# Patient Record
Sex: Female | Born: 1945 | Race: White | Hispanic: No | Smoking: Former smoker
Health system: Southern US, Community
[De-identification: ages and names within clinical notes are randomized; demographics above are authoritative.]

## PROBLEM LIST (undated history)

## (undated) DIAGNOSIS — M1990 Unspecified osteoarthritis, unspecified site: Secondary | ICD-10-CM

## (undated) DIAGNOSIS — H269 Unspecified cataract: Secondary | ICD-10-CM

## (undated) DIAGNOSIS — R011 Cardiac murmur, unspecified: Secondary | ICD-10-CM

## (undated) DIAGNOSIS — M810 Age-related osteoporosis without current pathological fracture: Secondary | ICD-10-CM

## (undated) DIAGNOSIS — N200 Calculus of kidney: Secondary | ICD-10-CM

## (undated) DIAGNOSIS — F419 Anxiety disorder, unspecified: Secondary | ICD-10-CM

## (undated) DIAGNOSIS — D334 Benign neoplasm of spinal cord: Secondary | ICD-10-CM

## (undated) DIAGNOSIS — E785 Hyperlipidemia, unspecified: Secondary | ICD-10-CM

## (undated) DIAGNOSIS — Z87442 Personal history of urinary calculi: Secondary | ICD-10-CM

## (undated) DIAGNOSIS — H3530 Unspecified macular degeneration: Secondary | ICD-10-CM

## (undated) DIAGNOSIS — C20 Malignant neoplasm of rectum: Secondary | ICD-10-CM

## (undated) DIAGNOSIS — C189 Malignant neoplasm of colon, unspecified: Secondary | ICD-10-CM

## (undated) DIAGNOSIS — G709 Myoneural disorder, unspecified: Secondary | ICD-10-CM

## (undated) DIAGNOSIS — D649 Anemia, unspecified: Secondary | ICD-10-CM

## (undated) HISTORY — DX: Myoneural disorder, unspecified: G70.9

## (undated) HISTORY — PX: COLONOSCOPY: SHX174

## (undated) HISTORY — PX: TONSILLECTOMY: SUR1361

## (undated) HISTORY — DX: Hyperlipidemia, unspecified: E78.5

## (undated) HISTORY — DX: Malignant neoplasm of colon, unspecified: C18.9

## (undated) HISTORY — DX: Cardiac murmur, unspecified: R01.1

## (undated) HISTORY — PX: EYE SURGERY: SHX253

## (undated) HISTORY — DX: Anemia, unspecified: D64.9

## (undated) HISTORY — DX: Unspecified osteoarthritis, unspecified site: M19.90

## (undated) HISTORY — DX: Unspecified macular degeneration: H35.30

## (undated) HISTORY — DX: Benign neoplasm of spinal cord: D33.4

## (undated) HISTORY — DX: Calculus of kidney: N20.0

## (undated) HISTORY — DX: Malignant neoplasm of rectum: C20

## (undated) HISTORY — PX: ABDOMINAL HYSTERECTOMY: SHX81

## (undated) HISTORY — DX: Unspecified cataract: H26.9

## (undated) HISTORY — DX: Age-related osteoporosis without current pathological fracture: M81.0

---

## 2015-11-21 HISTORY — PX: SPINE SURGERY: SHX786

## 2019-08-21 NOTE — Progress Notes (Signed)
[place]   [phone] Fax:([phone]) [facility] Note   Patient Care Team: System, Pcp Not In as PCP - General  Date of Service:  08/25/2019   CHIEF COMPLAINTS/PURPOSE OF CONSULTATION:  Rectal Cancer oncology   REFERRING PHYSICIAN:  Dr. Morton Stall  Oncology History Overview Note  Cancer Staging Rectal cancer [facility]) Staging form: Colon and Rectum, AJCC 8th Edition - Clinical stage from 05/11/2017: cT3, cN2, cM0 - Signed by Truitt Merle, MD on 08/25/2019    Rectal cancer ([place])  05/11/2017 Cancer Staging   Staging form: Colon and Rectum, AJCC 8th Edition - Clinical stage from 05/11/2017: cT3, cN2, cM0 - Signed by Truitt Merle, MD on 08/25/2019   05/11/2017 Procedure   Colonoscopy by Dr Crissie Reese at [facility] -Palpable rectal mass found on digital rectal exam  -Likely malignant tumor in the rectum. Biopsied.  -One 6 mm polyp in the sigmoid colon, removed with a cold snare, resected and retrieved.  -Diverticulosis at the hepatic flexure and in the ascending colon -the examined portion of the ileum was normal.    09/10/2017 Initial Biopsy   Final Diagnosis  1. Sigmoid Polyp biopsies:  Tubular Adenoma  2. Rectal Mass biopsies:  Moderately differentiated adenocarcinoma, invasive.  The depth of the invasion cannot be assessed in this biopsy specimen.     - 06/29/2017 Chemotherapy   She received neoadjuvant infusional 5FU with concurrent radiation.     09/17/2017 Procedure   Sigmoidoscopy by Dr. Tami Ribas at [facility] on 09/17/17  was noted to have complete response from chemoRT   10/31/2017 - 01/2018 Chemotherapy   Consolidation Chemo 5FU and Leucovorin. Oxaliplatin was deferred due to pre-existing neuropathy. Pt declined rectal surgery.    01/13/2019 Imaging   CT CAP W Contrast at [facility] show persistent and stable 64mm nodule in the right middle lobe and stable 61mm nodule in the superior segment of  the left lower lobe. No new or suspect nodules   Cholelithiasis without evidence of cholecystitis.   Mild compression of the superior plate of L5, Stable   Overall no evidence of disease recurrence in the chest, abdomen or pelvis.    08/25/2019 Initial Diagnosis   Rectal cancer (HCC)      HISTORY OF PRESENTING ILLNESS:  [name] 73 y.o. female is a here because of rectal cancer surveillance. The patient was referred by Dr. Morton Stall. The patient presents to the clinic today alone.   She notes GI bleeding for months before going to be seen because she had hemorrhoids. She was found to be stage IIIB rectal cancer. She did Concurrent chemoRT for 3-4 months. She also had adjuvant chemo. She notes she declined surgery for colostomy bag as she is does would not be able to tolerate bag. She notes she still has port which needs to be flushed.   Today she notes occasional pain in her rectum. In [place] 2 months ago she had a flare of severe rectal pain. She would only take medication for this for extreme pain. She notes 1-1.5 weeks ago her pain flared again (over 10/10) so much she felt she could go to ED. She took tramadol for this and has not again since. After that pain flare the pain lasted for 1-2 at a dull constant pain.  Around these times of pain flares she denied constipation. She notes her BM are regular. Her last colonoscopy was 05/2018.  She also notes chronic back pain  and has osteoporosis. She notes last week she fell at new home. She feels she fractured her right rib as she has done this in the past. She notes 05/2019 DEXA which showed osteoporosis -3.5. She is notes she was doing yearly Reclast injection, last in 05/2019.  She notes she was taking Lidocaine patches but would like a refill as she is almost out of it. She also takes diclofenac 1-2 tablets a day about 1-2 times a month and would like a refill of that as well.  She was recommended to see [facility] for PCP.  She has reached out to them but has not been called back yet.   Socially she is divorced. She moved to [place] 2 months ago from [place] to live closer to move in with her daughter and son-in-law. So she wanted to be seen at this clinic to follow her cancer closely. She stopped smoking 29 years ago at age 30 years old after smoking for 26 years 1-5ppd. She uses CBD as needed.  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 She is looking for ophthalmologist. 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 She had hysterectomy and BSO due to heavy bleeding. She also had C-section. She notes her mother died from 109 cancer. Her PGM had breast cancer. Her paternal half sister had lung cancer. Her maternal half brother had pancreatic cancer.    REVIEW OF SYSTEMS:    Constitutional: Denies fevers, chills or abnormal night sweats Eyes: Denies blurriness of vision, double vision or watery eyes Ears, nose, mouth, throat, and face: Denies mucositis or sore throat Respiratory: Denies cough, dyspnea or wheezes Cardiovascular: Denies palpitation, chest discomfort or lower extremity swelling Gastrointestinal:  Denies nausea, heartburn or change in bowel habits (+) Rectal pain  Skin: Denies abnormal skin rashes MSK: (+) Chronic back pain  Lymphatics: Denies new lymphadenopathy or easy bruising Neurological:Denies numbness, tingling or new weaknesses Behavioral/Psych: Mood is stable, no new changes  All other systems were reviewed with the patient and are negative.   MEDICAL HISTORY:  Past Medical History:  Diagnosis Date   Colon cancer ([place])    Neuromuscular disorder ([place])    Osteoporosis     SURGICAL HISTORY: Past Surgical History:  Procedure Laterality Date   ABDOMINAL HYSTERECTOMY      SOCIAL HISTORY: Social History   Socioeconomic History   Marital status: Divorced    Spouse name: Not on file   Number of  children: 1   Years of education: Not on file   Highest education level: Not on file  Occupational History   Not on file  Social Needs   Financial resource strain: Not on file   Food insecurity    Worry: Not on file    Inability: Not on file   Transportation needs    Medical: Not on file    Non-medical: Not on file  Tobacco Use   Smoking status: Former Smoker    Packs/day: 2.00    Years: 26.00    Pack years: 52.00    Quit date: 11/21/1987    Years since quitting: 31.7   Smokeless tobacco: Never Used  Substance and Sexual Activity   Alcohol use: Not on file   Drug use: Yes    Types: Marijuana   Sexual activity: Not on file  Lifestyle   Physical activity    Days per week: Not on file    Minutes per session: Not  on file   Stress: Not on file  Relationships   Social connections    Talks on phone: Not on file    Gets together: Not on file    Attends religious service: Not on file    Active member of club or organization: Not on file    Attends meetings of clubs or organizations: Not on file    Relationship status: Not on file   Intimate partner violence    Fear of current or ex partner: Not on file    Emotionally abused: Not on file    Physically abused: Not on file    Forced sexual activity: Not on file  Other Topics Concern   Not on file  Social History Narrative   Not on file    FAMILY HISTORY: Family History  Problem Relation Age of Onset   Cancer Mother        head and neck cancer    Cancer Sister        lung cancer    Cancer Brother        pancreatic cancer    Cancer Paternal Grandmother        breast cancer     ALLERGIES:  is allergic to morphine and related.  MEDICATIONS:  Current Outpatient Medications  Medication Sig Dispense Refill   diclofenac (CATAFLAM) 50 MG tablet Take 1 tablet (50 mg total) by mouth 3 (three) times daily. 20 tablet 1   lidocaine (LIDODERM) 5 % Place 1 patch onto the skin daily. Remove & Discard patch  within 12 hours or as directed by MD 30 patch 1   traMADol (ULTRAM) 50 MG tablet Take 50 mg by mouth every 6 (six) hours as needed (once a day).     valACYclovir (VALTREX) 1000 MG tablet Take 1,000 mg by mouth daily.     No current facility-administered medications for this visit.     PHYSICAL EXAMINATION: ECOG PERFORMANCE STATUS: 0 - Asymptomatic  Vitals:   08/25/19 1458  BP: (!) 143/73  Pulse: 87  Resp: 18  Temp: 98.5 F (36.9 C)  SpO2: 97%   Filed Weights   08/25/19 1458  Weight: 155 lb 11.2 oz (70.6 kg)    GENERAL:alert, no distress and comfortable SKIN: skin color, texture, turgor are normal, no rashes or significant lesions EYES: normal, Conjunctiva are pink and non-injected, sclera clear  NECK: supple, thyroid normal size, non-tender, without nodularity LYMPH:  no palpable lymphadenopathy in the cervical, axillary  LUNGS: clear to auscultation and percussion with normal breathing effort HEART: regular rate & rhythm and no murmurs and no lower extremity edema ABDOMEN:abdomen soft, non-tender and normal bowel sounds Musculoskeletal:no cyanosis of digits and no clubbing  NEURO: alert & oriented x 3 with fluent speech, no focal motor/sensory deficits RECTAL: (+) large externall hemorrhoids. No palpable mass. No blood on glove. Benign exam   LABORATORY DATA:  I have reviewed the data as listed No flowsheet data found.  No flowsheet data found.   RADIOGRAPHIC STUDIES: I have personally reviewed the radiological images as listed and agreed with the findings in the report. No results found.  ASSESSMENT & PLAN:  Geanie Glasby is a 73 y.o. [place] female with a history of Macular degeneration and cataract, Schwannoma of lumbar spine.   1. Rectal Cancer, Stage IIIB  -Diagnosed on 05/11/17. She was treated with concurrent ChemoRT and consolidation chemo 5-FU/LV for 3 months. She declined surgery as she did not want colostomy bag.  -Based on 09/17/17  Sigmoidoscopy  she had complete response from chemoRT.  Subsequent CT scan also showed no evidence of residual disease. -I discussed although standard care includes ChemoRT, surgery and adjuvant chemo, but some pts could be safely monitored   she has been doing well without surgery.  -I discussed shel has higher risk of cancer recurrence based on her clinical stage and no surgery. She has been followed with Surveillance since treatment. She has moved from [place] and has transferred her care to [place].  -I discussed continuing Surveillance, which is a physical exam and lab test (including CBC, CMP and CEA) every 3-4 months for the first 3 years, then every 6-12 months, colonoscopy every 6-12 months for up to 3-4 years, and surveillance CT or MRI scan every 6-12 month for up to 5 year.   -Her 06/03/18 sigmoidoscopy, 09/2018 MRI pelvis and 01/13/19 CT CAP were NED.  -In the past 2 months she has had flares of rectal pain. Rectal and physical exam today was unremarkable except hemorrhoids.  -She will proceed with colonoscopy in next few months, CT CAP this month and F/u in 4 months.  -She declined flu shot.    2. Rectal  pain  -2 months ago when she was in [place] she had significant flare of rectal pain. This occurred again and more severe 1-2 weeks ago.  -She only took Tramadol that night for it. Otherwise she takes Diclofenac or Tramadol as needed.  -She has regular BM and no GI bleeding.  -Her last colonoscopy was 05/2018. Will repeat this year when she finds new GI, I referred today  -Rectal exam benign today with hemorrhoids (08/25/19)   3. Chronic back pain, Osteoporosis  -04/2018 DEXA showed osteoporosis. Per patient she had DEXA in 05/2019 which showed Osteoporosis with T-score -3.5  -She did have recent fall and feels she has right rib fracture. She does not think she needs a scan to evaluate. -She has been on Reclast yearly injections since 05/22/18. Last injection in 05/2019.  -She uses  Lidocaine patches for pain, Diclofenac for significant pain 1-2 times a day 1-2 times a month. Only for severe pain she will take Tramadol.   4. Lung nodules  -Stable on 01/13/19 CT scan she has 57mm right lung and 78mm left lung nodules.  -She has 26 years of very heavy smoking (1-5ppd). She quit at age 71.  -Will monitor.    PLAN:  -I refilled Diclofenac and Lidocaine patches  -Send [facility] referral for colonoscopy in 1-2 months  -Lab, flush and CT CAP W Contrast in 2 weeks  -Port flush in 2 and 4 months  -Lab and f/u in 4 months    Orders Placed This Encounter  Procedures   CBC with Differential ([facility] Only)    Standing Status:   Standing    Number of Occurrences:   50    Standing Expiration Date:   08/24/2024   CMP ([facility] only)    Standing Status:   Standing    Number of Occurrences:   50    Standing Expiration Date:   08/24/2024   CEA (IN HOUSE-[facility])    Standing Status:   Standing    Number of Occurrences:   50    Standing Expiration Date:   08/24/2024   Ambulatory referral to Gastroenterology    Referral Priority:   Routine    Referral Type:   Consultation    Referral Reason:   Specialty Services Required    Number of Visits Requested:  1    All questions were answered. The patient knows to call the clinic with any problems, questions or concerns. I spent 40 minutes counseling the patient face to face. The total time spent in the appointment was 50 minutes and more than 50% was on counseling.     Truitt Merle, MD 08/25/2019   I, Joslyn Devon, am acting as scribe for Truitt Merle, MD.   I have reviewed the above documentation for accuracy and completeness, and I agree with the above.

## 2019-08-24 ENCOUNTER — Other Ambulatory Visit: Payer: Self-pay | Admitting: Hematology

## 2019-08-25 ENCOUNTER — Encounter: Payer: Self-pay | Admitting: Hematology

## 2019-08-25 ENCOUNTER — Inpatient Hospital Stay: Payer: Medicaid Other | Attending: Hematology | Admitting: Hematology

## 2019-08-25 ENCOUNTER — Telehealth: Payer: Self-pay | Admitting: Hematology

## 2019-08-25 ENCOUNTER — Other Ambulatory Visit: Payer: Self-pay

## 2019-08-25 DIAGNOSIS — Z87891 Personal history of nicotine dependence: Secondary | ICD-10-CM

## 2019-08-25 DIAGNOSIS — G8929 Other chronic pain: Secondary | ICD-10-CM | POA: Diagnosis not present

## 2019-08-25 DIAGNOSIS — H5462 Unqualified visual loss, left eye, normal vision right eye: Secondary | ICD-10-CM | POA: Diagnosis not present

## 2019-08-25 DIAGNOSIS — K6289 Other specified diseases of anus and rectum: Secondary | ICD-10-CM | POA: Diagnosis not present

## 2019-08-25 DIAGNOSIS — Z808 Family history of malignant neoplasm of other organs or systems: Secondary | ICD-10-CM | POA: Diagnosis not present

## 2019-08-25 DIAGNOSIS — Z800 Family history of malignant neoplasm of digestive organs: Secondary | ICD-10-CM | POA: Diagnosis not present

## 2019-08-25 DIAGNOSIS — M810 Age-related osteoporosis without current pathological fracture: Secondary | ICD-10-CM

## 2019-08-25 DIAGNOSIS — Z801 Family history of malignant neoplasm of trachea, bronchus and lung: Secondary | ICD-10-CM

## 2019-08-25 DIAGNOSIS — R911 Solitary pulmonary nodule: Secondary | ICD-10-CM

## 2019-08-25 DIAGNOSIS — C20 Malignant neoplasm of rectum: Secondary | ICD-10-CM

## 2019-08-25 DIAGNOSIS — Z803 Family history of malignant neoplasm of breast: Secondary | ICD-10-CM

## 2019-08-25 DIAGNOSIS — M545 Low back pain: Secondary | ICD-10-CM

## 2019-08-25 DIAGNOSIS — Z9181 History of falling: Secondary | ICD-10-CM | POA: Diagnosis not present

## 2019-08-25 MED ORDER — DICLOFENAC POTASSIUM 50 MG PO TABS: 50.0000 mg | ORAL_TABLET | Freq: Three times a day (TID) | ORAL | 1 refills | Status: DC

## 2019-08-25 MED ORDER — LIDOCAINE 5 % EX PTCH: 1.0000 | MEDICATED_PATCH | CUTANEOUS | 1 refills | Status: DC

## 2019-08-25 NOTE — Telephone Encounter (Signed)
Gave avs and calendar ° °

## 2019-08-26 ENCOUNTER — Encounter: Payer: Self-pay | Admitting: Hematology

## 2019-08-26 ENCOUNTER — Telehealth: Payer: Self-pay

## 2019-08-26 ENCOUNTER — Telehealth: Payer: Self-pay | Admitting: Gastroenterology

## 2019-08-26 ENCOUNTER — Other Ambulatory Visit: Payer: Self-pay

## 2019-08-26 DIAGNOSIS — C20 Malignant neoplasm of rectum: Secondary | ICD-10-CM

## 2019-08-26 NOTE — Telephone Encounter (Signed)
Hey Dr. Rush Landmark- we have received a referral for this patient to be seen at our office for a flexible sigmoidoscopy. I have received her records and you are the doc of the day. I am going to send these records to you for review.

## 2019-08-26 NOTE — Telephone Encounter (Signed)
Chart review.

## 2019-08-27 NOTE — Telephone Encounter (Signed)
Will review records when they are available. Thank you. GM

## 2019-08-31 NOTE — Telephone Encounter (Signed)
Review of outside records  July 2019 flexible sigmoidoscopy Perianal and digital exam were normal Evidence of previous rectal scar from CRT at first rectal valve.  Biopsied.  The exam was otherwise normal.  October 2018 flexible sigmoidoscopy Perianal exam included a palpable posterior rectal scar at first about presumably from neoadjuvant change. A 50 mm scar was found in the distal rectum.  There was residual polyp tissue.  This was biopsied with a cold jumbo forceps for histology.  The exam was otherwise without abnormality.  June 2018 colonoscopy for indication of hematochezia  The digital rectal exam included a palpable rectal mass. A fungating infiltrative and ulcerated nonobstructing large mass was found in the rectum.  The mass was partially circumferential involving one third of the lumen circumference.  The mass measured 7 cm in length.  No bleeding was present.  Biopsies were taken. A 6 mm polyp on the sigmoid colon.  This was semi-pedunculated.  This was removed with cold snare. A few medium mouth diverticula at the hepatic flexure and ascending colon. The exam is otherwise normal throughout the examined colon. The terminal ileum appeared normal.   The patient for some reason has not had a formal full colonoscopy post her chemoradiation.  As such, I think a formal full colonoscopy would be helpful rather than just a flexible sigmoidoscopy has a will also help [place] make sure there is nothing else in the colon as well as evaluate the rectum as is being asked by oncology.  Okay to proceed with scheduling full colonoscopy direct procedure and then we can discuss symptoms of a rectal pain thereafter. If hemorrhoids are found as the most recent physical exam by oncology suggested then will decide about the role of hemorrhoidal banding if they are internal versus colorectal surgery evaluation if they are external. None of her endoscopic evaluation over the course the last 2 years from [facility]  has shown any evidence of hemorrhoids. This is why I would like to also proceed with a full repeat colonoscopy for [place] to know what is going on here.    Justice Britain, MD [facility] # [id number]

## 2019-09-01 NOTE — Telephone Encounter (Signed)
Sounds good, Dr. Rush Landmark. Please proceed with colonoscopy soon. Thanks!  Truitt Merle MD

## 2019-09-01 NOTE — Telephone Encounter (Signed)
Please move forward with scheduling colonoscopy in [place]. Thanks. GM

## 2019-09-08 ENCOUNTER — Other Ambulatory Visit: Payer: Self-pay

## 2019-09-08 ENCOUNTER — Ambulatory Visit (HOSPITAL_COMMUNITY)
Admission: RE | Admit: 2019-09-08 | Discharge: 2019-09-08 | Disposition: A | Discharge status: Home / Self Care | Payer: Medicaid Other | Source: Ambulatory Visit | Attending: Hematology | Admitting: Hematology

## 2019-09-08 ENCOUNTER — Encounter (HOSPITAL_COMMUNITY): Payer: Self-pay | Admitting: Radiology

## 2019-09-08 ENCOUNTER — Inpatient Hospital Stay: Payer: Medicaid Other

## 2019-09-08 ENCOUNTER — Telehealth: Payer: Self-pay

## 2019-09-08 DIAGNOSIS — Z95828 Presence of other vascular implants and grafts: Secondary | ICD-10-CM | POA: Insufficient documentation

## 2019-09-08 DIAGNOSIS — Z5111 Encounter for antineoplastic chemotherapy: Secondary | ICD-10-CM | POA: Diagnosis not present

## 2019-09-08 DIAGNOSIS — C20 Malignant neoplasm of rectum: Secondary | ICD-10-CM | POA: Diagnosis not present

## 2019-09-08 DIAGNOSIS — R52 Pain, unspecified: Secondary | ICD-10-CM

## 2019-09-08 DIAGNOSIS — Z452 Encounter for adjustment and management of vascular access device: Secondary | ICD-10-CM | POA: Diagnosis not present

## 2019-09-08 LAB — CBC WITH DIFFERENTIAL (CANCER CENTER ONLY)
Abs Immature Granulocytes: 0.02 10*3/uL (ref 0.00–0.07)
Basophils Absolute: 0.1 10*3/uL (ref 0.0–0.1)
Basophils Relative: 1 %
Eosinophils Absolute: 0.1 10*3/uL (ref 0.0–0.5)
Eosinophils Relative: 2 %
HCT: 38 % (ref 36.0–46.0)
Hemoglobin: 12.8 g/dL (ref 12.0–15.0)
Immature Granulocytes: 0 %
Lymphocytes Relative: 19 %
Lymphs Abs: 1 10*3/uL (ref 0.7–4.0)
MCH: 29.2 pg (ref 26.0–34.0)
MCHC: 33.7 g/dL (ref 30.0–36.0)
MCV: 86.6 fL (ref 80.0–100.0)
Monocytes Absolute: 0.5 10*3/uL (ref 0.1–1.0)
Monocytes Relative: 8 %
Neutro Abs: 3.8 10*3/uL (ref 1.7–7.7)
Neutrophils Relative %: 70 %
Platelet Count: 257 10*3/uL (ref 150–400)
RBC: 4.39 MIL/uL (ref 3.87–5.11)
RDW: 13.5 % (ref 11.5–15.5)
WBC Count: 5.5 10*3/uL (ref 4.0–10.5)
nRBC: 0 % (ref 0.0–0.2)

## 2019-09-08 LAB — CMP (CANCER CENTER ONLY)
ALT: 11 U/L (ref 0–44)
AST: 14 U/L — ABNORMAL LOW (ref 15–41)
Albumin: 4.1 g/dL (ref 3.5–5.0)
Alkaline Phosphatase: 38 U/L (ref 38–126)
Anion gap: 10 (ref 5–15)
BUN: 10 mg/dL (ref 8–23)
CO2: 22 mmol/L (ref 22–32)
Calcium: 10.1 mg/dL (ref 8.9–10.3)
Chloride: 107 mmol/L (ref 98–111)
Creatinine: 0.74 mg/dL (ref 0.44–1.00)
GFR, Est AFR Am: >60 mL/min (ref >60)
GFR, Estimated: >60 mL/min (ref >60)
Glucose, Bld: 104 mg/dL — ABNORMAL HIGH (ref 70–99)
Potassium: 4.1 mmol/L (ref 3.5–5.1)
Sodium: 139 mmol/L (ref 135–145)
Total Bilirubin: 0.3 mg/dL (ref 0.3–1.2)
Total Protein: 7 g/dL (ref 6.5–8.1)

## 2019-09-08 LAB — CEA (IN HOUSE-CHCC): CEA (CHCC-In House): <1 ng/mL (ref 0.00–5.00)

## 2019-09-08 MED ORDER — DICLOFENAC POTASSIUM(MIGRAINE) 50 MG PO PACK: 50.0000 mg | PACK | Freq: Three times a day (TID) | ORAL | 0 refills | Status: DC

## 2019-09-08 MED ORDER — SODIUM CHLORIDE 0.9% FLUSH
10.0000 mL | INTRAVENOUS | Status: DC | PRN
  Administered 2019-09-08 (×2): 10 mL via INTRAVENOUS
  Filled 2019-09-08: qty 10

## 2019-09-08 MED ORDER — IOHEXOL 300 MG/ML  SOLN
30.0000 mL | Freq: Once | INTRAMUSCULAR | Status: AC | PRN
  Administered 2019-09-08: 30 mL via ORAL

## 2019-09-08 MED ORDER — HEPARIN SOD (PORK) LOCK FLUSH 100 UNIT/ML IV SOLN
INTRAVENOUS | Status: AC
  Filled 2019-09-08: qty 5

## 2019-09-08 MED ORDER — SODIUM CHLORIDE (PF) 0.9 % IJ SOLN
INTRAMUSCULAR | Status: AC
  Filled 2019-09-08: qty 50

## 2019-09-08 MED ORDER — HEPARIN SOD (PORK) LOCK FLUSH 100 UNIT/ML IV SOLN
500.0000 [IU] | Freq: Once | INTRAVENOUS | Status: AC
  Administered 2019-09-08: 500 [IU] via INTRAVENOUS

## 2019-09-08 MED ORDER — IOHEXOL 300 MG/ML  SOLN
100.0000 mL | Freq: Once | INTRAMUSCULAR | Status: AC | PRN
  Administered 2019-09-08: 100 mL via INTRAVENOUS

## 2019-09-08 MED ORDER — SODIUM CHLORIDE 0.9% FLUSH
10.0000 mL | INTRAVENOUS | Status: DC | PRN
  Filled 2019-09-08: qty 10

## 2019-09-08 NOTE — Telephone Encounter (Signed)
Patient requesting refills on her lidocaine patches and diclofenac potassium prescriptions. Saw that prescriptions had been sent to pharmacy on file on 08/25/19 so called pharmacy to see if they had an issue with the prescriptions. Pharmacist I spoke to said that patient's Medicaid would not cover the prescriptions (most likely because there were OTC options) so her out of pocket cost for the lidocaine patches would be $68.77 and $22.23 for the diclofenac potassium.   Called patient back with update. Patient was very upset because "back in [place] my [place] covered everything". Informed the patient she could buy generic lidocaine patches OTC for about $8 (though they would be slightly lesser in strength) and that Dr. Burr Medico was willing to try Meloxicam if she was insistent to have a prescription NSAID and couldn't afford the out of pocket cost for the diclofenac potassium. Patient stated she would call [place] Medicaid tomorrow to see what could be done and call the office back afterwards. Denied any other needs at this time.

## 2019-09-08 NOTE — Patient Instructions (Signed)

## 2019-09-09 ENCOUNTER — Telehealth: Payer: Self-pay | Admitting: Nurse Practitioner

## 2019-09-09 NOTE — Telephone Encounter (Signed)
I tried to call her to discuss pain management. No answer. Will try to reach her at a later date.  Sherry Rue, NP 09/09/19

## 2019-09-10 ENCOUNTER — Encounter: Payer: Self-pay | Admitting: Gastroenterology

## 2019-09-10 NOTE — Telephone Encounter (Signed)
Patient scheduled on 11/17 for colonoscopy.

## 2019-09-11 ENCOUNTER — Telehealth: Payer: Self-pay | Admitting: Nurse Practitioner

## 2019-09-11 NOTE — Telephone Encounter (Signed)
I called the patient back to discuss her pain and medication options. She has chronic back pain, previously on lidocaine patch that was approved by insurance in [place]. Since moving to [place] the patch is not approved. This is sold over the counter for $8, she states she does not have income and her family can not buy it. I offered to refer her to financial advocate for co-pay assistance and she is not interested. There are not many options for topical pain remedies approved by Medicaid. I informed her I will not prescribe fentanyl patch for chronic, non-cancer related pain. I reviewed her recent labs which are normal and CT that shows rectal thickening likely radiation related but otherwise no evidence of disease. She has PCP visit scheduled in November. I recommend to alternate tylenol and NSAID, use heat, and try to get lidocaine OTC. She has tramadol that helps but makes her constipated. I recommend to try tramadol PRN and add stool softener. She refused. I told her if she is in severe uncontrolled pain with no means to get pain medication, she will need to go to ED. She understands. Cira Rue, NP  09/11/19

## 2019-09-24 ENCOUNTER — Ambulatory Visit (AMBULATORY_SURGERY_CENTER): Payer: Medicaid Other | Admitting: *Deleted

## 2019-09-24 ENCOUNTER — Other Ambulatory Visit: Payer: Self-pay

## 2019-09-24 VITALS — Temp 97.4°F | Ht 63.0 in | Wt 157.0 lb

## 2019-09-24 DIAGNOSIS — C20 Malignant neoplasm of rectum: Secondary | ICD-10-CM

## 2019-09-24 DIAGNOSIS — Z1159 Encounter for screening for other viral diseases: Secondary | ICD-10-CM

## 2019-09-24 NOTE — Progress Notes (Signed)
Pt is aware that care partner will wait in the car during procedure; if they feel like they will be too hot or cold to wait in the car; they may wait in the 4 th floor lobby. Patient is aware to bring only one care partner. We want them to wear a mask (we do not have any that we can provide them), practice social distancing, and we will check their temperatures when they get here.  I did remind the patient that their care partner needs to stay in the parking lot the entire time and have a cell phone available, we will call them when the pt is ready for discharge. Patient will wear mask into building.   No egg or soy allergy  No home oxygen use or problems with anesthesia  No medications for weight loss taken  emmi information given  covid test is 10-02-19 at 1040

## 2019-10-02 ENCOUNTER — Ambulatory Visit (INDEPENDENT_AMBULATORY_CARE_PROVIDER_SITE_OTHER): Payer: Medicaid Other

## 2019-10-02 ENCOUNTER — Other Ambulatory Visit: Payer: Self-pay | Admitting: Gastroenterology

## 2019-10-02 ENCOUNTER — Telehealth: Payer: Self-pay

## 2019-10-02 DIAGNOSIS — Z1159 Encounter for screening for other viral diseases: Secondary | ICD-10-CM

## 2019-10-02 NOTE — Telephone Encounter (Signed)
Faxed office note to Dr. Jackson Latino at [facility]. Fax [phone]  Per patient's request.

## 2019-10-03 ENCOUNTER — Ambulatory Visit: Payer: Medicaid Other | Admitting: Nurse Practitioner

## 2019-10-03 LAB — SARS CORONAVIRUS 2 (TAT 6-24 HRS): SARS Coronavirus 2: NEGATIVE

## 2019-10-07 ENCOUNTER — Encounter: Payer: Self-pay | Admitting: Gastroenterology

## 2019-10-07 ENCOUNTER — Ambulatory Visit (AMBULATORY_SURGERY_CENTER): Payer: Medicaid Other | Admitting: Gastroenterology

## 2019-10-07 ENCOUNTER — Other Ambulatory Visit: Payer: Self-pay

## 2019-10-07 VITALS — BP 116/71 | HR 65 | Temp 98.3°F | Resp 11 | Ht 63.0 in | Wt 157.0 lb

## 2019-10-07 DIAGNOSIS — K5520 Angiodysplasia of colon without hemorrhage: Secondary | ICD-10-CM

## 2019-10-07 DIAGNOSIS — Z85040 Personal history of malignant carcinoid tumor of rectum: Secondary | ICD-10-CM | POA: Diagnosis not present

## 2019-10-07 DIAGNOSIS — K642 Third degree hemorrhoids: Secondary | ICD-10-CM | POA: Diagnosis not present

## 2019-10-07 DIAGNOSIS — K5730 Diverticulosis of large intestine without perforation or abscess without bleeding: Secondary | ICD-10-CM | POA: Diagnosis not present

## 2019-10-07 DIAGNOSIS — Z1211 Encounter for screening for malignant neoplasm of colon: Secondary | ICD-10-CM | POA: Diagnosis not present

## 2019-10-07 DIAGNOSIS — C20 Malignant neoplasm of rectum: Secondary | ICD-10-CM | POA: Diagnosis not present

## 2019-10-07 DIAGNOSIS — Z85038 Personal history of other malignant neoplasm of large intestine: Secondary | ICD-10-CM | POA: Diagnosis not present

## 2019-10-07 DIAGNOSIS — K635 Polyp of colon: Secondary | ICD-10-CM | POA: Diagnosis not present

## 2019-10-07 DIAGNOSIS — K6289 Other specified diseases of anus and rectum: Secondary | ICD-10-CM

## 2019-10-07 DIAGNOSIS — D122 Benign neoplasm of ascending colon: Secondary | ICD-10-CM

## 2019-10-07 HISTORY — PX: COLONOSCOPY: SHX174

## 2019-10-07 MED ORDER — SODIUM CHLORIDE 0.9 % IV SOLN: 500.0000 mL | Freq: Once | INTRAVENOUS | Status: DC

## 2019-10-07 NOTE — Progress Notes (Signed)
Report given to PACU, vss 

## 2019-10-07 NOTE — Op Note (Signed)
[place] Patient Name: [name] Procedure Date: 10/07/2019 11:14 AM MRN: [id number] Endoscopist: Justice Britain , MD Age: 73 Referring MD:  Date of Birth: 19-Jun-1946 Gender: Female Account #: [id number] Procedure:                Colonoscopy Indications:              High risk colon cancer surveillance: Personal                            history of rectal cancerIncidental - Rectal pain in                            2018 s/p Chemo-XRT, Rectal pain NOS - not clearly                            defined or resultant of bowel habits Medicines:                Monitored Anesthesia Care Procedure:                Pre-Anesthesia Assessment:                           - Prior to the procedure, a History and Physical                            was performed, and patient medications and                            allergies were reviewed. The patient's tolerance of                            previous anesthesia was also reviewed. The risks                            and benefits of the procedure and the sedation                            options and risks were discussed with the patient.                            All questions were answered, and informed consent                            was obtained. Prior Anticoagulants: The patient has                            taken no previous anticoagulant or antiplatelet                            agents except for NSAID medication. ASA Grade                            Assessment: II - A patient with mild systemic  disease. After reviewing the risks and benefits,                            the patient was deemed in satisfactory condition to                            undergo the procedure.                           After obtaining informed consent, the colonoscope                            was passed under direct vision. Throughout the                            procedure, the patient's blood  pressure, pulse, and                            oxygen saturations were monitored continuously. The                            Colonoscope was introduced through the anus and                            advanced to the 5 cm into the ileum. The                            colonoscopy was performed without difficulty. The                            patient tolerated the procedure. The quality of the                            bowel preparation was good. The terminal ileum,                            ileocecal valve, appendiceal orifice, and rectum                            were photographed. Scope In: 11:28:42 AM Scope Out: 11:48:11 AM Scope Withdrawal Time: 0 hours 15 minutes 46 seconds  Total Procedure Duration: 0 hours 19 minutes 29 seconds  Findings:                 The digital rectal exam findings include                            hemorrhoids. Pertinent negatives include no                            palpable rectal lesions.                           The terminal ileum and ileocecal valve appeared  normal.                           A 4 mm polyp was found in the ascending colon. The                            polyp was sessile. The polyp was removed with a                            cold snare. Resection and retrieval were complete.                           A single small angioectasia with typical                            arborization was found in the proximal ascending                            colon.                           A small scar was found in the distal rectum. The                            scar tissue was healthy in appearance.                           Multiple small-mouthed diverticula were found in                            the recto-sigmoid colon, sigmoid colon, descending                            colon and hepatic flexure.                           Normal mucosa was found in the entire colon                            otherwise.                            Non-bleeding non-thrombosed external and internal                            hemorrhoids were found during retroflexion, during                            perianal exam and during digital exam. The                            hemorrhoids were Grade III (internal hemorrhoids                            that prolapse but require manual reduction). Complications:  No immediate complications. Estimated Blood Loss:     Estimated blood loss was minimal. Impression:               - Hemorrhoids found on digital rectal exam.                           - The examined portion of the ileum was normal.                           - One 4 mm polyp in the ascending colon, removed                            with a cold snare. Resected and retrieved.                           - A single colonic angioectasia.                           - Scar in the distal rectum.                           - Diverticulosis in the recto-sigmoid colon, in the                            sigmoid colon, in the descending colon and at the                            hepatic flexure.                           - Normal mucosa in the entire examined colon                            otherwise.                           - Non-bleeding non-thrombosed external and internal                            hemorrhoids. Recommendation:           - The patient will be observed post-procedure,                            until all discharge criteria are met.                           - Discharge patient to home.                           - Patient has a contact number available for                            emergencies. The signs and symptoms of potential  delayed complications were discussed with the                            patient. Return to normal activities tomorrow.                            Written discharge instructions were provided to the                            patient.                            - High fiber diet.                           - Use FiberCon 1 tablet PO daily.                           - Continue present medications.                           - Await pathology results.                           - Repeat colonoscopy in 3 years for surveillance.                           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 Low dose TCA can be considered. May                            consider role of anorectal manometry. She needs to                            have continued soft bowel movements and no                            straining. Could consider pelvic floor retraining                            with PT.                           - The findings and recommendations were discussed                            with the patient. Justice Britain, MD 10/07/2019 11:59:33 AM

## 2019-10-07 NOTE — Progress Notes (Signed)
Called to room to assist during endoscopic procedure.  Patient ID and intended procedure confirmed with present staff. Received instructions for my participation in the procedure from the performing physician.  

## 2019-10-07 NOTE — Progress Notes (Signed)
Pt's states no medical or surgical changes since previsit or office visit. 

## 2019-10-07 NOTE — Patient Instructions (Signed)
Thank you for allowing [place] to care for you today.  Await pathology results by mail, approximately 1-2 weeks.  Repeat colonoscopy in 3 years.  Recommend high fiber diet and taking FiberCon one tab daily by mouth.  Will call to set up follow up appointment in 6 weeks.  Will discuss options going forward.  Keep bowel movements soft with no straining.  The high fiber diet and FiberCon along with drinking plenty of water will help with this.  Resume medications as before.  Return to normal activities tomorrow.     YOU HAD AN ENDOSCOPIC PROCEDURE TODAY AT [facility]:   Refer to the procedure report that was given to you for any specific questions about what was found during the examination.  If the procedure report does not answer your questions, please call your gastroenterologist to clarify.  If you requested that your care partner not be given the details of your procedure findings, then the procedure report has been included in a sealed envelope for you to review at your convenience later.  YOU SHOULD EXPECT: Some feelings of bloating in the abdomen. Passage of more gas than usual.  Walking can help get rid of the air that was put into your GI tract during the procedure and reduce the bloating. If you had a lower endoscopy (such as a colonoscopy or flexible sigmoidoscopy) you may notice spotting of blood in your stool or on the toilet paper. If you underwent a bowel prep for your procedure, you may not have a normal bowel movement for a few days.  Please Note:  You might notice some irritation and congestion in your nose or some drainage.  This is from the oxygen used during your procedure.  There is no need for concern and it should clear up in a day or so.  SYMPTOMS TO REPORT IMMEDIATELY:   Following lower endoscopy (colonoscopy or flexible sigmoidoscopy):  Excessive amounts of blood in the stool  Significant tenderness or worsening of abdominal pains  Swelling of the  abdomen that is new, acute  Fever of 100F or higher   For urgent or emergent issues, a gastroenterologist can be reached at any hour by calling [phone].   DIET:  We do recommend a small meal at first, but then you may proceed to your regular diet.  Drink plenty of fluids but you should avoid alcoholic beverages for 24 hours.  ACTIVITY:  You should plan to take it easy for the rest of today and you should NOT DRIVE or use heavy machinery until tomorrow (because of the sedation medicines used during the test).    FOLLOW UP: Our staff will call the number listed on your records 48-72 hours following your procedure to check on you and address any questions or concerns that you may have regarding the information given to you following your procedure. If we do not reach you, we will leave a message.  We will attempt to reach you two times.  During this call, we will ask if you have developed any symptoms of COVID 19. If you develop any symptoms (ie: fever, flu-like symptoms, shortness of breath, cough etc.) before then, please call [phone].  If you test positive for Covid 19 in the 2 weeks post procedure, please call and report this information to [place].    If any biopsies were taken you will be contacted by phone or by letter within the next 1-3 weeks.  Please call us at [phone] if you  have not heard about the biopsies in 3 weeks.    SIGNATURES/CONFIDENTIALITY: You and/or your care partner have signed paperwork which will be entered into your electronic medical record.  These signatures attest to the fact that that the information above on your After Visit Summary has been reviewed and is understood.  Full responsibility of the confidentiality of this discharge information lies with you and/or your care-partner.

## 2019-10-09 ENCOUNTER — Encounter: Payer: Self-pay | Admitting: Gastroenterology

## 2019-10-09 ENCOUNTER — Telehealth: Payer: Self-pay | Admitting: Hematology

## 2019-10-09 ENCOUNTER — Telehealth: Payer: Self-pay

## 2019-10-09 NOTE — Telephone Encounter (Signed)
Mansouraty, Telford Nab., MD sent to Timothy Lasso, RN        Patty or covering RN, Please have the patient return to clinic with me or one of the PAs in approximately 3 to 6 weeks. 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  Thank you. GM

## 2019-10-09 NOTE — Telephone Encounter (Signed)
  Follow up Call-  Call back number 10/07/2019  Post procedure Call Back phone  # [phone]  Permission to leave phone message Yes     Patient questions:  Do you have a fever, pain , or abdominal swelling? No. Pain Score  0 *  Have you tolerated food without any problems? Yes.    Have you been able to return to your normal activities? Yes.    Do you have any questions about your discharge instructions: Diet   No. Medications  No. Follow up visit  Yes.    Do you have questions or concerns about your Care? Yes.     Patient has questions regarding surveillance colonoscopy schedule. Report states Dr. Rush Landmark would like a repeat colonoscopy in 3 years. Patient concerned that is too long to wait because her doctor in [place] was doing them at least every 6 months. She was also getting CT scans of her lungs every 6 months. I told patient that Dr. Rush Landmark will give her a more definitive follow up plan once her pathology results are returned, but that I would send him a message noting her concerns. She verbalizes understanding.   Actions: * If pain score is 4 or above: No action needed, pain <4. 1. Have you developed a fever since your procedure? no  2.   Have you had an respiratory symptoms (SOB or cough) since your procedure? no  3.   Have you tested positive for COVID 19 since your procedure no  4.   Have you had any family members/close contacts diagnosed with the COVID 19 since your procedure?  no   If yes to any of these questions please route to Joylene John, RN and Alphonsa Gin, Therapist, sports.

## 2019-10-09 NOTE — Telephone Encounter (Signed)
Left message for patient to call back  

## 2019-10-09 NOTE — Telephone Encounter (Signed)
Attempted call back this morning. Left message. Patty or covering RN, later today please reach out to the patient and let her know the following: 1) our pathology has not returned yet from her polyp. 2) most guidelines suggest that after a 1 year follow-up colonoscopy for colorectal cancer a 3-year follow-up would be indicated if polyp surveillance is not deemed to be needed sooner. 3) I am very happy to provide colonoscopies or evaluations at earlier intervals if Oncology (Dr. Burr Medico) were to feel that that is necessary. 4) I am glad that she is doing well post procedure once we get our final pathology will dictate the follow-up in clinic Thank you. GM

## 2019-10-09 NOTE — Telephone Encounter (Signed)
I discussed with Dr. Rush Landmark about her rectal cancer screening. He will repeat sigmoidoscopy in 6 months, I plan to repeat pelvic MRI in 5 months (6 months from her last CT). I plan to order when I see her back in February 2021.  I called patient, and left her message about the above plan.  She knows to call me if she has further questions.  [name]  10/09/2019

## 2019-10-09 NOTE — Progress Notes (Signed)
After further discussion with Dr. Burr Medico from oncology I was brought up-to-date with some of the new ASCO update in patients who receive solely chemoradiation for rectal cancer stage T3/T4. The protocol suggests a flexible sigmoidoscopy should be performed every 6 months for at least 3 years post radiation and colonoscopy to be performed based on colon polyp surveillance after a 1 year full colonoscopy. As I performed the patient's first full colonoscopy this year be at approximately 2 years since her chemoradiation, we will perform and proceed with 2 years worth of flexible sigmoidoscopies. Her next full colonoscopy will be 3 years from now in 2023. I will place a recall reminder for a flexible sigmoidoscopy in 6 months and we will continue that until 2022 and subsequently in 2023 then proceed with a formal full colonoscopy.  Justice Britain, MD [facility] Office # [phone]

## 2019-10-09 NOTE — Telephone Encounter (Signed)
Recall put in for Flex sig in 6 months  and Colonoscopy in 3 yrs.

## 2019-10-10 ENCOUNTER — Telehealth: Payer: Self-pay

## 2019-10-10 NOTE — Telephone Encounter (Signed)
Spoke with patient per Dr. Ernestina Penna note, she states she didn't get a voice mail. Dr. Burr Medico has spoken with Dr. Rush Landmark about her rectal cancer screening.  He will repeat sigmoidoscopy in 6 months, Dr. Burr Medico plans to repeat pelvic MRI in 5 months (6 months from her last CT scan).  The patient verbalized an understanding.

## 2019-10-13 ENCOUNTER — Telehealth: Payer: Self-pay

## 2019-10-13 NOTE — Telephone Encounter (Signed)
Appt made for 11/28/19 at 1130 am with Dr Rush Landmark.  Letter mailed with appt info

## 2019-10-13 NOTE — Telephone Encounter (Signed)
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 Thank you.GM

## 2019-10-20 ENCOUNTER — Telehealth: Payer: Self-pay | Admitting: Gastroenterology

## 2019-10-20 NOTE — Telephone Encounter (Signed)
Pt called about path results. She is aware that we will call her when they are available.

## 2019-10-20 NOTE — Telephone Encounter (Signed)
Noted  

## 2019-10-21 DIAGNOSIS — B0223 Postherpetic polyneuropathy: Secondary | ICD-10-CM | POA: Diagnosis not present

## 2019-10-21 DIAGNOSIS — F419 Anxiety disorder, unspecified: Secondary | ICD-10-CM | POA: Diagnosis not present

## 2019-10-21 DIAGNOSIS — M545 Low back pain: Secondary | ICD-10-CM | POA: Diagnosis not present

## 2019-10-21 DIAGNOSIS — C20 Malignant neoplasm of rectum: Secondary | ICD-10-CM | POA: Diagnosis not present

## 2019-10-24 ENCOUNTER — Other Ambulatory Visit: Payer: Medicaid Other

## 2019-10-24 ENCOUNTER — Telehealth: Payer: Self-pay | Admitting: Emergency Medicine

## 2019-10-24 ENCOUNTER — Inpatient Hospital Stay: Payer: Medicaid Other

## 2019-10-24 NOTE — Telephone Encounter (Signed)
Called pt regarding missed flush appt this am, no answer.  LVM stating that scheduling dept would be calling back to reschedule a flush appt for her w/in the next 1-2 weeks.  Scheduling message inbasket sent.

## 2019-10-27 ENCOUNTER — Telehealth: Payer: Self-pay | Admitting: Hematology

## 2019-10-27 NOTE — Telephone Encounter (Signed)
R/s appt per 12/4 sch message-pt aware of new appt date and time

## 2019-11-03 ENCOUNTER — Inpatient Hospital Stay: Payer: Medicaid Other | Attending: Hematology

## 2019-11-03 ENCOUNTER — Other Ambulatory Visit: Payer: Self-pay

## 2019-11-03 DIAGNOSIS — C20 Malignant neoplasm of rectum: Secondary | ICD-10-CM | POA: Insufficient documentation

## 2019-11-03 DIAGNOSIS — Z452 Encounter for adjustment and management of vascular access device: Secondary | ICD-10-CM | POA: Insufficient documentation

## 2019-11-03 DIAGNOSIS — Z95828 Presence of other vascular implants and grafts: Secondary | ICD-10-CM

## 2019-11-03 MED ORDER — HEPARIN SOD (PORK) LOCK FLUSH 100 UNIT/ML IV SOLN
500.0000 [IU] | Freq: Once | INTRAVENOUS | Status: AC
  Administered 2019-11-03: 500 [IU] via INTRAVENOUS
  Filled 2019-11-03: qty 5

## 2019-11-03 MED ORDER — SODIUM CHLORIDE 0.9% FLUSH
10.0000 mL | INTRAVENOUS | Status: DC | PRN
  Administered 2019-11-03: 10 mL via INTRAVENOUS
  Filled 2019-11-03: qty 10

## 2019-11-03 NOTE — Patient Instructions (Signed)

## 2019-11-28 ENCOUNTER — Ambulatory Visit (INDEPENDENT_AMBULATORY_CARE_PROVIDER_SITE_OTHER): Payer: Medicaid Other | Admitting: Gastroenterology

## 2019-11-28 ENCOUNTER — Other Ambulatory Visit: Payer: Self-pay

## 2019-11-28 ENCOUNTER — Encounter: Payer: Self-pay | Admitting: Gastroenterology

## 2019-11-28 VITALS — BP 130/78 | HR 81 | Temp 97.6°F | Ht 64.0 in | Wt 157.0 lb

## 2019-11-28 DIAGNOSIS — K6289 Other specified diseases of anus and rectum: Secondary | ICD-10-CM

## 2019-11-28 DIAGNOSIS — C20 Malignant neoplasm of rectum: Secondary | ICD-10-CM | POA: Diagnosis not present

## 2019-11-28 DIAGNOSIS — M7989 Other specified soft tissue disorders: Secondary | ICD-10-CM

## 2019-11-28 NOTE — Patient Instructions (Signed)
It has been recommended to you by your physician that you have a(n) flex-sig completed. We did not schedule the procedure(s) today.You will need to be scheduled in May 2021.Our office will contact you in April 2021 to schedule. If you do not hear from our office in 6-8 weeks please call us at [phone]  Ask for [place].   If you are age 74 or older, your body mass index should be between 23-30. Your Body mass index is 26.95 kg/m. If this is out of the aforementioned range listed, please consider follow up with your Primary Care Provider.  If you are age 66 or younger, your body mass index should be between 19-25. Your Body mass index is 26.95 kg/m. If this is out of the aformentioned range listed, please consider follow up with your Primary Care Provider.    Thank you for choosing me and [facility].  Dr. Rush Landmark

## 2019-11-29 ENCOUNTER — Encounter: Payer: Self-pay | Admitting: Gastroenterology

## 2019-11-29 DIAGNOSIS — M25421 Effusion, right elbow: Secondary | ICD-10-CM | POA: Insufficient documentation

## 2019-11-29 DIAGNOSIS — K6289 Other specified diseases of anus and rectum: Secondary | ICD-10-CM | POA: Insufficient documentation

## 2019-11-29 NOTE — Progress Notes (Signed)
[facility] VISIT   Primary Care Provider Truitt Merle, MD [place] [address] [phone]  Patient Profile: [name] is a 74 y.o. 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  The patient presents to the [facility] for an evaluation and management of problem(s) noted below:  Problem List 1. Rectal cancer ([place])   2. Proctalgia   3. Swelling of arm     History of Present Illness This is a patient who was diagnosed with rectal cancer in 2018.  She underwent chemo and XRT rather than surgery for management of her rectal cancer.  She is subsequently followed closely by oncology.  She moved from [place] to [place] and is now under the care of Dr. Burr Medico from oncology.  She was followed by GI closely with flexible sigmoidoscopies.  She had reported nature of proctalgia of unclear etiology but these were symptoms only that had occurred after her treatment.  She had been treated with Bentyl as well as stool softeners.  We performed a recent colonoscopy in November 2020.  This was the first full colonoscopy after completion of her treatment at the time, the patient was found to have hemorrhoids, normal ileum, small polypoid lesion returning as lymphoid tissue, a colonic angiectasia, a distal rectum scar (presumably from prior rectal cancer), as well as diverticulosis.  Since the patient's colonoscopy, she has done well.  She has not had or experienced any recurrent proctalgia.  She does take acyclovir at times when she gets very nervous or anxious and has had to use that more recently as result of the recent issues surrounding the [place].  She has not actually had any overt herpes rashes develop.  Patient has had some arm swelling at  times in her antecubital joint currently there is no significant swelling but she worries about this and it has not been valuated previously.  It is not painful at this time but it comes and goes.  She notes at times having swelling in the back of her legs bilaterally but that also comes and goes and nothing is currently swollen.  GI Review of Systems Positive as above Negative for dysphagia, odynophagia, abdominal pain, nausea, vomiting, change in bowel habits, melena, hematochezia  Review of Systems General: Denies fevers/chills/weight loss HEENT: Denies oral lesions Cardiovascular: Denies chest pain/palpitations Pulmonary: Denies shortness of breath Gastroenterological: See HPI Genitourinary: Denies darkened urine Hematological: Denies easy bruising/bleeding Dermatological: Denies jaundice Psychological: Mood is stable   Medications Current Outpatient Medications  Medication Sig Dispense Refill  . diclofenac (CATAFLAM) 50 MG tablet Take 1 tablet (50 mg total) by mouth 3 (three) times daily. 20 tablet 1  . lidocaine (LIDODERM) 5 % Place 1 patch onto the skin daily. Remove & Discard patch within 12 hours or as directed by MD 30 patch 1  . traMADol (ULTRAM) 50 MG tablet Take 50 mg by mouth every 6 (six) hours as needed (once a day).    . valACYclovir (VALTREX) 1000 MG tablet Take 1,000 mg by mouth daily.     No current facility-administered medications for this visit.    Allergies Allergies  Allergen Reactions  . Morphine And Related Anaphylaxis    Histories Past Medical History:  Diagnosis Date  . Arthritis   . Cataract   . Colon cancer ([place])   . Kidney stones   . Macular  degeneration   . Neuromuscular disorder ([place])   . Osteoporosis   . Rectal cancer ([place])   . Schwannoma of spinal cord [facility])    Past Surgical History:  Procedure Laterality Date  . ABDOMINAL HYSTERECTOMY    . COLONOSCOPY    . TONSILLECTOMY     Social History   Socioeconomic History  . Marital  status: Divorced    Spouse name: Not on file  . Number of children: 1  . Years of education: Not on file  . Highest education level: Not on file  Occupational History  . Not on file  Tobacco Use  . Smoking status: Former Smoker    Packs/day: 2.00    Years: 26.00    Pack years: 52.00    Quit date: 11/21/1987    Years since quitting: 32.0  . Smokeless tobacco: Never Used  Substance and Sexual Activity  . Alcohol use: Yes    Comment: wine socially  . Drug use: Yes    Types: Marijuana    Comment: very rare use  . Sexual activity: Not on file  Other Topics Concern  . Not on file  Social History Narrative  . Not on file   Social Determinants of Health   Financial Resource Strain:   . Difficulty of Paying Living Expenses: Not on file  Food Insecurity:   . Worried About Charity fundraiser in the Last Year: Not on file  . Ran Out of Food in the Last Year: Not on file  Transportation Needs:   . Lack of Transportation (Medical): Not on file  . Lack of Transportation (Non-Medical): Not on file  Physical Activity:   . Days of Exercise per Week: Not on file  . Minutes of Exercise per Session: Not on file  Stress:   . Feeling of Stress : Not on file  Social Connections:   . Frequency of Communication with Friends and Family: Not on file  . Frequency of Social Gatherings with Friends and Family: Not on file  . Attends Religious Services: Not on file  . Active Member of Clubs or Organizations: Not on file  . Attends Archivist Meetings: Not on file  . Marital Status: Not on file  Intimate Partner Violence:   . Fear of Current or Ex-Partner: Not on file  . Emotionally Abused: Not on file  . Physically Abused: Not on file  . Sexually Abused: Not on file   Family History  Problem Relation Age of Onset  . Cancer Mother        head and neck cancer   . Cancer Sister        lung cancer   . Cancer Brother        pancreatic cancer   . Pancreatic cancer Brother   .  Cancer Paternal Grandmother        breast cancer   . Colon cancer Neg Hx   . Esophageal cancer Neg Hx   . Rectal cancer Neg Hx   . Stomach cancer Neg Hx   . Inflammatory bowel disease Neg Hx   . Liver disease Neg Hx    I have reviewed her medical, social, and family history in detail and updated the electronic medical record as necessary.    PHYSICAL EXAMINATION  BP 130/78   Pulse 81   Temp 97.6 F (36.4 C)   Ht 5\' 4"  (1.626 m)   Wt 157 lb (71.2 kg)   BMI 26.95 kg/m  Wt Readings from  Last 3 Encounters:  11/28/19 157 lb (71.2 kg)  10/07/19 157 lb (71.2 kg)  09/24/19 157 lb (71.2 kg)  GEN: NAD, appears stated age, doesn't appear chronically ill PSYCH: Cooperative, without pressured speech EYE: Conjunctivae pink, sclerae anicteric ENT: MMM CV: Nontachycardic RESP: No wheezing appreciated GI: Soft,, NT/ND MSK/EXT: Right upper extremity with slight palpation of vein versus lymph node in antecubital region but no significant pain or discomfort and nothing noted in left upper extremity, no significant lower extremity edema or palpable cords SKIN: No jaundice NEURO:  Alert & Oriented x 3, no focal deficits   REVIEW OF DATA  I reviewed the following data at the time of this encounter:  GI Procedures and Studies  November 2020 Colonoscopy - Hemorrhoids found on digital rectal exam. - The examined portion of the ileum was normal. - One 4 mm polyp in the ascending colon, removed with a cold snare. Resected and retrieved. - A single colonic angioectasia. - Scar in the distal rectum. - Diverticulosis in the recto-sigmoid colon, in the sigmoid colon, in the descending colon and at the hepatic flexure. - Normal mucosa in the entire examined colon otherwise. - Non-bleeding non-thrombosed external and internal hemorrhoids.  Laboratory Studies  Reviewed those in epic  Imaging Studies  October 2020 CT chest abdomen pelvis IMPRESSION: 1. Areas of mild rectal wall thickening,  likely radiation change. No discrete rectal mass is identified. No perirectal or pelvic or retroperitoneal lymphadenopathy. 2. No findings suspicious for metastatic disease involving the liver or lungs. 3. Cholelithiasis.   ASSESSMENT  [name] is a 74 y.o. 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  The patient is seen today for evaluation and management of:  1. Rectal cancer ([place])   2. Proctalgia   3. Swelling of arm    The patient is doing well post colonoscopy.  After discussion with Dr. Burr Medico, and review of the recent NCCN guidelines as well as recent rectal cancer guidelines, we will be planning a flexible sigmoidoscopy every 6 months at least through 2022.  A full colonoscopy would normally be indicated in 2023 however the patient would prefer a full colonoscopy and I think it is reasonable due to her history of rectal cancer without surgical intervention for [place] to consider that.  Thus we will do a flexible sigmoidoscopy in 6 months from last procedure and plan a full colonoscopy at 1 year mark from last colonoscopy.  We will follow these guidelines and appreciate our oncology colleagues with helping [place] to move forward as well.  Will defer imaging to our oncology colleagues.  In regards to the patient's right upper extremity swelling that comes and goes it may be venous or lymph in nature but nothing is currently concerning on her exam.  I have asked her to discuss this with her primary care provider and consideration of the potential right upper extremity noninvasive ultrasound may be considered to ensure there is nothing else is going on and ensure good flow of her venous and arterial access.  She is extremely anxious about the state of the [employer] and is taking acyclovir daily right  now to help minimize "discomfort and pain in her back that she gets when she gets anxious.  I asked her to be very mindful of taking this on a daily basis as there can be nephrotoxicity at times.  I will  see her back for her flexible sigmoidoscopy in 4 months.  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  We may have to consider biofeedback pelvic floor retraining and/or nitroglycerin or diltiazem ointment to the area should this recur.  Hopefully will not, and she would do well.  All patient questions were answered, to the best of my ability, and the patient agrees to the aforementioned plan of action with follow-up as indicated.   PLAN  Flexible sigmoidoscopy in May 2021 Colonoscopy in November 2021 Follow-up with oncology Monitor for signs/symptoms of proctalgia fugux Follow-up with PCP about right upper extremity antecubital swelling (on/off)   No orders of the defined types were placed in this encounter.   New Prescriptions   No medications on file   Modified Medications   No medications on file    Planned Follow Up No follow-ups on file.   Total Time in Face-to-Face and in Coordination of Care for patient including review/personal interpretation of prior testing, medical history, examination, medication adjustment, documentation with the EHR is greater than 30 minutes.  Justice Britain, MD [facility] # [phone]

## 2019-12-19 NOTE — Progress Notes (Signed)
[name]   [phone] Fax:([phone]   Clinic Follow up Note   Patient Care Team: Truitt Merle, MD as PCP - General (Hematology)  Date of Service:  12/25/2019  CHIEF COMPLAINT: F/u of rectal cancer   SUMMARY OF ONCOLOGIC HISTORY: Oncology History Overview Note  Cancer Staging Rectal cancer [facility]) Staging form: Colon and Rectum, AJCC 8th Edition - Clinical stage from 05/11/2017: cT3, cN2, cM0 - Signed by Truitt Merle, MD on 08/25/2019    Rectal cancer ([place])  05/11/2017 Cancer Staging   Staging form: Colon and Rectum, AJCC 8th Edition - Clinical stage from 05/11/2017: cT3, cN2, cM0 - Signed by Truitt Merle, MD on 08/25/2019   05/11/2017 Procedure   Colonoscopy by Dr Crissie Reese at [facility] -Palpable rectal mass found on digital rectal exam  -Likely malignant tumor in the rectum. Biopsied.  -One 6 mm polyp in the sigmoid colon, removed with a cold snare, resected and retrieved.  -Diverticulosis at the hepatic flexure and in the ascending colon -the examined portion of the ileum was normal.    09/10/2017 Initial Biopsy   Final Diagnosis  1. Sigmoid Polyp biopsies:  Tubular Adenoma  2. Rectal Mass biopsies:  Moderately differentiated adenocarcinoma, invasive.  The depth of the invasion cannot be assessed in this biopsy specimen.     - 06/29/2017 Chemotherapy   She received neoadjuvant infusional 5FU with concurrent radiation.     09/17/2017 Procedure   Sigm[facility] by [facility] Tami Ribas [facility] on 09/17/17  was noted to have complete response from chemoRT   10/31/2017 - 01/2018 Chemotherapy   Consolidation Chemo 5FU and Leucovorin. Oxaliplatin was deferred due to pre-existing neuropathy. Pt declined rectal surgery.    01/13/2019 Imaging   CT CAP W Contrast at [facility] show persistent and stable 84mm nodule in the right middle lobe and stable 78mm nodule in the superior segment of the left lower lobe. No new or  suspect nodules   Cholelithiasis without evidence of cholecystitis.   Mild compression of the superior plate of L5, Stable   Overall no evidence of disease recurrence in the chest, abdomen or pelvis.    08/25/2019 Initial Diagnosis   Rectal cancer ([place])   09/08/2019 Imaging   CT CAP W contrast 09/08/19  IMPRESSION: 1. Areas of mild rectal wall thickening, likely radiation change. No discrete rectal mass is identified. No perirectal or pelvic or retroperitoneal lymphadenopathy. 2. No findings suspicious for metastatic disease involving the liver or lungs. 3. Cholelithiasis.   10/07/2019 Procedure   Colonoscopy by Dr. Silvestre Moment 10/07/19 IMPRESSION - Hemorrhoids found on digital rectal exam. - The examined portion of the ileum was normal. - One 4 mm polyp in the ascending colon, removed with a cold snare. Resected and retrieved. - A single colonic angioectasia. - Scar in the distal rectum. - Diverticulosis in the recto-sigmoid colon, in the sigmoid colon, in the descending colon and at the hepatic flexure. - Normal mucosa in the entire examined colon otherwise. - Non-bleeding non-thrombosed external and internal hemorrhoids. Diagnosis Surgical [P], colon, ascending, polyp - BENIGN COLONIC MUCOSA WITH LYMPHOID AGGREGATE. - NO DYSPLASIA OR MALIGNANCY.      CURRENT THERAPY:  Surveillance   INTERVAL HISTORY:  [name] is here for a follow up of rectal cancer. She presents to the clinic alone.  She notes she did not have Flush 4 months and today flush did not initially have blood return. She notes she does want to keep her PAC  still.  She notes after her colonoscopy in 09/2019 she no longer has abdominal pain. She does note b/l lower back pain that radiates to pain. This flares when she gets up from sleeping. This improves or resolves when she walks around. This has been worse the last few days. OTC pain medication helped. She did not take her Tramadol. She notes  she is eating well and denies blood in stool. She notes occasional very active bowels.  She notes often getting bumps around her body like in inner elbow and posterior knee.    REVIEW OF SYSTEMS:   Constitutional: Denies fevers, chills or abnormal weight loss Eyes: Denies blurriness of vision Ears, nose, mouth, throat, and face: Denies mucositis or sore throat Respiratory: Denies cough, dyspnea or wheezes Cardiovascular: Denies palpitation, chest discomfort or lower extremity swelling Gastrointestinal:  Denies nausea, heartburn or change in bowel habits (+) Occasional very active bowels Skin: Denies abnormal skin rashes MSK: (+) B/l lower back pain  Lymphatics: Denies new lymphadenopathy or easy bruising Neurological:Denies numbness, tingling or new weaknesses Behavioral/Psych: Mood is stable, no new changes  All other systems were reviewed with the patient and are negative.  MEDICAL HISTORY:  Past Medical History:  Diagnosis Date  . Arthritis   . Cataract   . Colon cancer ([place])   . Kidney stones   . Macular degeneration   . Neuromuscular disorder ([place])   . Osteoporosis   . Rectal cancer ([place])   . Schwannoma of spinal cord [facility])     SURGICAL HISTORY: Past Surgical History:  Procedure Laterality Date  . ABDOMINAL HYSTERECTOMY    . COLONOSCOPY    . TONSILLECTOMY      I have reviewed the social history and family history with the patient and they are unchanged from previous note.  ALLERGIES:  is allergic to morphine and related.  MEDICATIONS:  Current Outpatient Medications  Medication Sig Dispense Refill  . diclofenac (CATAFLAM) 50 MG tablet Take 1 tablet (50 mg total) by mouth 3 (three) times daily. 20 tablet 1  . lidocaine (LIDODERM) 5 % Place 1 patch onto the skin daily. Remove & Discard patch within 12 hours or as directed by MD 30 patch 1  . traMADol (ULTRAM) 50 MG tablet Take 50 mg by mouth every 6 (six) hours as needed (once a day).    . valACYclovir (VALTREX)  1000 MG tablet Take 1,000 mg by mouth daily.     No current facility-administered medications for this visit.   Facility-Administered Medications Ordered in Other Visits  Medication Dose Route Frequency Provider Last Rate Last Admin  . heparin lock flush 100 unit/mL  500 Units Intravenous Once Truitt Merle, MD      . sodium chloride flush (NS) 0.9 % injection 10 mL  10 mL Intravenous PRN Truitt Merle, MD        PHYSICAL EXAMINATION: ECOG PERFORMANCE STATUS: 1 - Symptomatic but completely ambulatory  Vitals:   12/25/19 0853  BP: (!) 146/71  Pulse: 69  Resp: 17  Temp: 98.2 F (36.8 C)  SpO2: 98%   Filed Weights   12/25/19 0853  Weight: 156 lb 4.8 oz (70.9 kg)    GENERAL:alert, no distress and comfortable SKIN: skin color, texture, turgor are normal, no rashes or significant lesions (+) Extra fat tissue of inner right elbow.  EYES: normal, Conjunctiva are pink and non-injected, sclera clear  NECK: supple, thyroid normal size, non-tender, without nodularity LYMPH:  no palpable lymphadenopathy in the cervical, axillary  LUNGS: clear  to auscultation and percussion with normal breathing effort HEART: regular rate & rhythm and no murmurs and no lower extremity edema ABDOMEN:abdomen soft, non-tender and normal bowel sounds Musculoskeletal:no cyanosis of digits and no clubbing  NEURO: alert & oriented x 3 with fluent speech, no focal motor/sensory deficits Patient declined Rectal exam today   LABORATORY DATA:  I have reviewed the data as listed CBC Latest Ref Rng & Units 12/25/2019 09/08/2019  WBC 4.0 - 10.5 K/uL 5.5 5.5  Hemoglobin 12.0 - 15.0 g/dL 12.5 12.8  Hematocrit 36.0 - 46.0 % 37.9 38.0  Platelets 150 - 400 K/uL 145(L) 257     CMP Latest Ref Rng & Units 12/25/2019 09/08/2019  Glucose 70 - 99 mg/dL 103(H) 104(H)  BUN 8 - 23 mg/dL 12 10  Creatinine 0.44 - 1.00 mg/dL 0.78 0.74  Sodium 135 - 145 mmol/L 140 139  Potassium 3.5 - 5.1 mmol/L 4.1 4.1  Chloride 98 - 111 mmol/L 109  107  CO2 22 - 32 mmol/L 23 22  Calcium 8.9 - 10.3 mg/dL 10.1 10.1  Total Protein 6.5 - 8.1 g/dL 7.2 7.0  Total Bilirubin 0.3 - 1.2 mg/dL 0.5 0.3  Alkaline Phos 38 - 126 U/L 32(L) 38  AST 15 - 41 U/L 17 14(L)  ALT 0 - 44 U/L 13 11      RADIOGRAPHIC STUDIES: I have personally reviewed the radiological images as listed and agreed with the findings in the report. No results found.   ASSESSMENT & PLAN:  [name] is a 74 y.o. female with   1. Rectal Cancer, Stage IIIB  -Diagnosed on 05/11/17. She was treated with concurrent ChemoRT and consolidation chemo 5-FU/LV for 3 months. She declined surgery as she did not want colostomy bag. She is on surveillance. -Based on 09/17/17 Sigmoidoscopy she had complete response from chemoRT. Subsequent CT scan also showed no evidence of residual disease. She has been doing well without surgery.  -She is clinically doing well. Labs reviewed, CBC and CMP are unremarkable.Marland Kitchen Physical exam unremarkable, she declined rectal exam today. Her 08/2019 CT CAP showed NED. Her 09/2019 Colonoscopy showed 1 benign polyp with diverticulosis and hemorrhoids. There is no clinical concern for recurrence.  -She is 2.5 years from diagnosis. Her risk of recurrence significantly decreases after 3 years. Will continue 5 year surveillance.  -Based on the watchful wait protocol developed by [facility], will repeat pelvic MRI in 6 months (last one) and last sigmoidoscopy in June 2021 also.   -She still has PAC in place and will consider removal 3-5 years from diagnosis. I encouraged her to continue flushes every 8 weeks. She is agreeable.  -F/u in 5 months  -I encouraged her to f/u with PCP at least once a year to manage her overall health. She is agreeable.    2. Rectal pain  -She has occasional flares of her rectal pain. She will take Tramadol for severe pain, Otherwise she takes Diclofenac or Tramadol as needed.  -Her 09/2019 colonoscopy with Dr. Rush Landmark showed  scarring at rectum, hemorrhoids and 1 benign polyp.  -Not mentioned today, likely resolved or much improved.    3. Chronic back pain, Osteoporosis  -04/2018 DEXA showed osteoporosis. Per patient she had DEXA in 05/2019 which showed Osteoporosis with T-score -3.5  -She has been on Reclast yearly injections since 05/22/18. Last injection in 05/2019. If our clinic does not have this will switch her to Prolia or zometa every 6 months starting in 05/2020. She is agreeable.  -She notes  s/p surgery for her prior spinal Schwannoma she has had chronic back pain. She uses Lidocaine patches for pain, Diclofenac for significant pain. Only for severe pain she will take Tramadol.  -She notes recent increase in b/l lower back pain. To better manage her pain I will refer her to Dr. Maryjean Ka  4. Lung nodules  -Stable on 01/13/19 CT scan she has 62mm right lung and 37mm left lung nodules.  -She has 26 years of very heavy smoking (1-5ppd). She quit at age 30.  -Not seen on 08/2019 CT CAP. Likely benign and now resolved.    PLAN:  -Send referral to Dr. Maryjean Ka for her chronic back pain  -Flush in 2 and 4 months  -Lab, flush, f/u and injection in 5 months with pelvic MRI before visit  -will send a message to DrRush Landmark for her last sigmoidoscopy in June 2021    No problem-specific Assessment & Plan notes found for this encounter.   Orders Placed This Encounter  Procedures  . MR Pelvis W Wo Contrast    History of rectal cancer daignosed in 04/2017, s/p chemoRT, no surgery    Standing Status:   Future    Standing Expiration Date:   02/21/2021    Order Specific Question:   If indicated for the ordered procedure, I authorize the administration of contrast media per Radiology protocol    Answer:   Yes    Order Specific Question:   What is the patient's sedation requirement?    Answer:   No Sedation    Order Specific Question:   Does the patient have a pacemaker or implanted devices?    Answer:   No    Order  Specific Question:   Radiology Contrast Protocol - do NOT remove file path    Answer:   [email or web address]    Order Specific Question:   Preferred imaging location?    Answer:   [facility] (table limit-350 lbs)  . Ambulatory referral to Pain Clinic    Referral Priority:   Routine    Referral Type:   Consultation    Referral Reason:   Specialty Services Required    Requested Specialty:   Pain Medicine    Number of Visits Requested:   1   All questions were answered. The patient knows to call the clinic with any problems, questions or concerns. No barriers to learning was detected. The total time spent in the appointment was 30 minutes.     Truitt Merle, MD 12/25/2019   I, Joslyn Devon, am acting as scribe for Truitt Merle, MD.   I have reviewed the above documentation for accuracy and completeness, and I agree with the above.

## 2019-12-25 ENCOUNTER — Inpatient Hospital Stay (HOSPITAL_BASED_OUTPATIENT_CLINIC_OR_DEPARTMENT_OTHER): Payer: Medicaid Other | Admitting: Hematology

## 2019-12-25 ENCOUNTER — Other Ambulatory Visit: Payer: Self-pay | Admitting: *Deleted

## 2019-12-25 ENCOUNTER — Inpatient Hospital Stay: Payer: Medicaid Other | Attending: Hematology

## 2019-12-25 ENCOUNTER — Encounter: Payer: Self-pay | Admitting: Hematology

## 2019-12-25 ENCOUNTER — Inpatient Hospital Stay: Payer: Medicaid Other

## 2019-12-25 ENCOUNTER — Other Ambulatory Visit: Payer: Self-pay

## 2019-12-25 VITALS — BP 146/71 | HR 69 | Temp 98.2°F | Resp 17 | Ht 64.0 in | Wt 156.3 lb

## 2019-12-25 DIAGNOSIS — R918 Other nonspecific abnormal finding of lung field: Secondary | ICD-10-CM | POA: Insufficient documentation

## 2019-12-25 DIAGNOSIS — C20 Malignant neoplasm of rectum: Secondary | ICD-10-CM | POA: Diagnosis not present

## 2019-12-25 DIAGNOSIS — G8929 Other chronic pain: Secondary | ICD-10-CM | POA: Insufficient documentation

## 2019-12-25 DIAGNOSIS — Z95828 Presence of other vascular implants and grafts: Secondary | ICD-10-CM

## 2019-12-25 DIAGNOSIS — M810 Age-related osteoporosis without current pathological fracture: Secondary | ICD-10-CM | POA: Insufficient documentation

## 2019-12-25 DIAGNOSIS — Z452 Encounter for adjustment and management of vascular access device: Secondary | ICD-10-CM | POA: Insufficient documentation

## 2019-12-25 DIAGNOSIS — M545 Low back pain: Secondary | ICD-10-CM | POA: Diagnosis not present

## 2019-12-25 LAB — CBC WITH DIFFERENTIAL (CANCER CENTER ONLY)
Abs Immature Granulocytes: 0.02 10*3/uL (ref 0.00–0.07)
Basophils Absolute: 0 10*3/uL (ref 0.0–0.1)
Basophils Relative: 1 %
Eosinophils Absolute: 0.1 10*3/uL (ref 0.0–0.5)
Eosinophils Relative: 1 %
HCT: 37.9 % (ref 36.0–46.0)
Hemoglobin: 12.5 g/dL (ref 12.0–15.0)
Immature Granulocytes: 0 %
Lymphocytes Relative: 18 %
Lymphs Abs: 1 10*3/uL (ref 0.7–4.0)
MCH: 29 pg (ref 26.0–34.0)
MCHC: 33 g/dL (ref 30.0–36.0)
MCV: 87.9 fL (ref 80.0–100.0)
Monocytes Absolute: 0.4 10*3/uL (ref 0.1–1.0)
Monocytes Relative: 8 %
Neutro Abs: 4 10*3/uL (ref 1.7–7.7)
Neutrophils Relative %: 72 %
Platelet Count: 145 10*3/uL — ABNORMAL LOW (ref 150–400)
RBC: 4.31 MIL/uL (ref 3.87–5.11)
RDW: 13.8 % (ref 11.5–15.5)
WBC Count: 5.5 10*3/uL (ref 4.0–10.5)
nRBC: 0 % (ref 0.0–0.2)

## 2019-12-25 LAB — CMP (CANCER CENTER ONLY)
ALT: 13 U/L (ref 0–44)
AST: 17 U/L (ref 15–41)
Albumin: 4.3 g/dL (ref 3.5–5.0)
Alkaline Phosphatase: 32 U/L — ABNORMAL LOW (ref 38–126)
Anion gap: 8 (ref 5–15)
BUN: 12 mg/dL (ref 8–23)
CO2: 23 mmol/L (ref 22–32)
Calcium: 10.1 mg/dL (ref 8.9–10.3)
Chloride: 109 mmol/L (ref 98–111)
Creatinine: 0.78 mg/dL (ref 0.44–1.00)
GFR, Est AFR Am: >60 mL/min (ref >60)
GFR, Estimated: >60 mL/min (ref >60)
Glucose, Bld: 103 mg/dL — ABNORMAL HIGH (ref 70–99)
Potassium: 4.1 mmol/L (ref 3.5–5.1)
Sodium: 140 mmol/L (ref 135–145)
Total Bilirubin: 0.5 mg/dL (ref 0.3–1.2)
Total Protein: 7.2 g/dL (ref 6.5–8.1)

## 2019-12-25 LAB — CEA (IN HOUSE-CHCC): CEA (CHCC-In House): <1 ng/mL (ref 0.00–5.00)

## 2019-12-25 MED ORDER — ALTEPLASE 2 MG IJ SOLR
2.0000 mg | Freq: Once | INTRAMUSCULAR | Status: AC | PRN
  Administered 2019-12-25: 2 mg
  Filled 2019-12-25: qty 2

## 2019-12-25 MED ORDER — SODIUM CHLORIDE 0.9% FLUSH
10.0000 mL | INTRAVENOUS | Status: DC | PRN
  Filled 2019-12-25: qty 10

## 2019-12-25 MED ORDER — ALTEPLASE 2 MG IJ SOLR
INTRAMUSCULAR | Status: AC
  Filled 2019-12-25: qty 2

## 2019-12-25 MED ORDER — HEPARIN SOD (PORK) LOCK FLUSH 100 UNIT/ML IV SOLN
500.0000 [IU] | Freq: Once | INTRAVENOUS | Status: DC
  Filled 2019-12-25: qty 5

## 2019-12-25 NOTE — Progress Notes (Signed)
Contacted Dr. Burr Medico desk nurse Janifer Adie RN and made aware that no blood return and administered cath flo. Patient did not want to go to lab to be stuck peripherally but instead wait for cath-flo to work. Myrtle verbalized understanding and patient exited to the waiting room.

## 2019-12-25 NOTE — Addendum Note (Signed)
Addended by: Truitt Merle on: 12/25/2019 12:05 PM   Modules accepted: Orders

## 2019-12-26 ENCOUNTER — Telehealth: Payer: Self-pay | Admitting: Hematology

## 2019-12-26 NOTE — Telephone Encounter (Signed)
Appts already scheduled per 2/4 los.

## 2019-12-26 NOTE — Telephone Encounter (Signed)
Faxed referral order to Dr. Clydell Hakim at [phone], received faxed confirmation

## 2019-12-31 ENCOUNTER — Telehealth: Payer: Self-pay

## 2019-12-31 NOTE — Telephone Encounter (Signed)
I let Ms [name] know that her labs from her last visit were WNL.  I also provided her the number for Dr. Maryjean Ka office.  She verbalized understanding

## 2020-01-02 ENCOUNTER — Telehealth: Payer: Self-pay

## 2020-01-07 ENCOUNTER — Telehealth: Payer: Self-pay | Admitting: Hematology

## 2020-01-07 NOTE — Telephone Encounter (Signed)
FAXED RECORDS TO [facility] [phone]

## 2020-01-15 DIAGNOSIS — H5213 Myopia, bilateral: Secondary | ICD-10-CM | POA: Diagnosis not present

## 2020-01-22 NOTE — Telephone Encounter (Signed)
none

## 2020-02-11 DIAGNOSIS — Z01818 Encounter for other preprocedural examination: Secondary | ICD-10-CM | POA: Diagnosis not present

## 2020-02-11 DIAGNOSIS — H25812 Combined forms of age-related cataract, left eye: Secondary | ICD-10-CM | POA: Diagnosis not present

## 2020-02-23 DIAGNOSIS — H25812 Combined forms of age-related cataract, left eye: Secondary | ICD-10-CM | POA: Diagnosis not present

## 2020-02-23 DIAGNOSIS — H2512 Age-related nuclear cataract, left eye: Secondary | ICD-10-CM | POA: Diagnosis not present

## 2020-03-08 DIAGNOSIS — H2511 Age-related nuclear cataract, right eye: Secondary | ICD-10-CM | POA: Diagnosis not present

## 2020-03-08 DIAGNOSIS — H25811 Combined forms of age-related cataract, right eye: Secondary | ICD-10-CM | POA: Diagnosis not present

## 2020-03-19 ENCOUNTER — Other Ambulatory Visit: Payer: Self-pay

## 2020-03-19 ENCOUNTER — Inpatient Hospital Stay: Payer: Medicaid Other | Attending: Hematology

## 2020-03-19 DIAGNOSIS — Z95828 Presence of other vascular implants and grafts: Secondary | ICD-10-CM

## 2020-03-19 DIAGNOSIS — C20 Malignant neoplasm of rectum: Secondary | ICD-10-CM | POA: Insufficient documentation

## 2020-03-19 DIAGNOSIS — Z452 Encounter for adjustment and management of vascular access device: Secondary | ICD-10-CM | POA: Diagnosis present

## 2020-03-19 MED ORDER — HEPARIN SOD (PORK) LOCK FLUSH 100 UNIT/ML IV SOLN
500.0000 [IU] | Freq: Once | INTRAVENOUS | Status: AC
  Administered 2020-03-19: 500 [IU] via INTRAVENOUS
  Filled 2020-03-19: qty 5

## 2020-03-19 MED ORDER — SODIUM CHLORIDE 0.9% FLUSH
10.0000 mL | INTRAVENOUS | Status: DC | PRN
  Administered 2020-03-19: 10 mL via INTRAVENOUS
  Filled 2020-03-19: qty 10

## 2020-03-19 NOTE — Patient Instructions (Signed)

## 2020-03-24 DIAGNOSIS — H34832 Tributary (branch) retinal vein occlusion, left eye, with macular edema: Secondary | ICD-10-CM | POA: Diagnosis not present

## 2020-04-05 ENCOUNTER — Telehealth: Payer: Self-pay

## 2020-04-05 ENCOUNTER — Encounter: Payer: Self-pay | Admitting: Gastroenterology

## 2020-04-05 NOTE — Telephone Encounter (Signed)
Dear [name],   According to your medical record, it is time for you to schedule a Flexible Sigmoidoscopy.  The American Cancer Society recommends this procedure as a method to detect early colon cancer. Patients with a family history of colon cancer, or a personal history of colon polyps or imflammatory bowel disease are at increased risk.  This letter has been generated based on the recommendations made at the time of your prior procedure. If you feel that in your particular situation this may no longer apply, please contact our office.  Please call our office at [phone]) to schedule this appointment or to update your records at your earliest convenience.  Thank you for cooperating with [place] to provide you with the very best care possible.   Sincerely,    Sherry Britain, MD [facility] [phone]

## 2020-04-05 NOTE — Telephone Encounter (Signed)
The pt was not aware that it had already been 6 months since colonoscopy.  She will call back to make appt for flex at her convenience

## 2020-04-05 NOTE — Telephone Encounter (Signed)
Called this patient per Recall Sheet. Patient states she does not want to have the procedure and would like a call back explaining why he wants her to have it.

## 2020-04-07 ENCOUNTER — Telehealth: Payer: Self-pay

## 2020-04-07 NOTE — Telephone Encounter (Signed)
Medical release form completed and faxed to [phone]

## 2020-04-28 DIAGNOSIS — H34832 Tributary (branch) retinal vein occlusion, left eye, with macular edema: Secondary | ICD-10-CM | POA: Diagnosis not present

## 2020-05-18 ENCOUNTER — Telehealth: Payer: Self-pay | Admitting: Hematology

## 2020-05-20 ENCOUNTER — Ambulatory Visit (HOSPITAL_COMMUNITY): Payer: Medicaid Other

## 2020-05-26 ENCOUNTER — Other Ambulatory Visit: Payer: Medicaid Other

## 2020-05-26 ENCOUNTER — Ambulatory Visit: Payer: Medicaid Other

## 2020-05-26 ENCOUNTER — Telehealth: Payer: Self-pay | Admitting: Hematology

## 2020-05-26 ENCOUNTER — Ambulatory Visit: Payer: Medicaid Other | Admitting: Hematology

## 2020-05-26 ENCOUNTER — Telehealth: Payer: Self-pay

## 2020-05-26 NOTE — Telephone Encounter (Signed)
Dr. Burr Medico requests appts for 05/26/2020 be moved to 06/07/2020 after MRI.  I spoke with Ms Aldona Lento and she is agreeable.  Scheduling message sent.

## 2020-05-26 NOTE — Telephone Encounter (Signed)
-----   Message from Truitt Merle, MD sent at 05/25/2020 10:36 PM EDT ----- She has MRI on 7/15, please move her appointments from 7/19, thanks

## 2020-05-26 NOTE — Telephone Encounter (Signed)
R/s appt per 7/7 sch msg - pt is aware of new appt date and time.

## 2020-05-28 ENCOUNTER — Other Ambulatory Visit: Payer: Self-pay

## 2020-05-28 ENCOUNTER — Ambulatory Visit (AMBULATORY_SURGERY_CENTER): Payer: Self-pay

## 2020-05-28 VITALS — Ht 64.0 in | Wt 149.6 lb

## 2020-05-28 DIAGNOSIS — Z01818 Encounter for other preprocedural examination: Secondary | ICD-10-CM

## 2020-05-28 DIAGNOSIS — H34832 Tributary (branch) retinal vein occlusion, left eye, with macular edema: Secondary | ICD-10-CM | POA: Diagnosis not present

## 2020-05-28 DIAGNOSIS — Z85038 Personal history of other malignant neoplasm of large intestine: Secondary | ICD-10-CM

## 2020-05-28 NOTE — Progress Notes (Signed)
No egg or soy allergy known to patient  No issues with past sedation with any surgeries  or procedures, no intubation problems  No diet pills per patient No home 02 use per patient  No blood thinners per patient  Pt denies issues with constipation  No A fib or A flutter   COVID 19 guidelines implemented in [place] today   COVID screening scheduled for 06/10/2020 at 8:10 am; patient is aware;   Due to the COVID-19 pandemic we are asking patients to follow these guidelines. Please only bring one care partner. Please be aware that your care partner may wait in the car in the parking lot or if they feel like they will be too hot to wait in the car, they may wait in the lobby on the 4th floor. All care partners are required to wear a mask the entire time (we do not have any that we can provide them), they need to practice social distancing, and we will do a Covid check for all patient's and care partners when you arrive. Also we will check their temperature and your temperature. If the care partner waits in their car they need to stay in the parking lot the entire time and we will call them on their cell phone when the patient is ready for discharge so they can bring the car to the front of the building. Also all patient's will need to wear a mask into building.

## 2020-06-03 ENCOUNTER — Other Ambulatory Visit: Payer: Self-pay

## 2020-06-03 ENCOUNTER — Ambulatory Visit (HOSPITAL_COMMUNITY)
Admission: RE | Admit: 2020-06-03 | Discharge: 2020-06-03 | Disposition: A | Discharge status: Home / Self Care | Payer: Medicaid Other | Source: Ambulatory Visit | Attending: Hematology | Admitting: Hematology

## 2020-06-03 DIAGNOSIS — K6289 Other specified diseases of anus and rectum: Secondary | ICD-10-CM | POA: Diagnosis not present

## 2020-06-03 DIAGNOSIS — C20 Malignant neoplasm of rectum: Secondary | ICD-10-CM | POA: Diagnosis not present

## 2020-06-03 DIAGNOSIS — K6389 Other specified diseases of intestine: Secondary | ICD-10-CM | POA: Diagnosis not present

## 2020-06-03 DIAGNOSIS — Z90710 Acquired absence of both cervix and uterus: Secondary | ICD-10-CM | POA: Diagnosis not present

## 2020-06-03 DIAGNOSIS — Z85048 Personal history of other malignant neoplasm of rectum, rectosigmoid junction, and anus: Secondary | ICD-10-CM | POA: Diagnosis not present

## 2020-06-03 MED ORDER — GADOBUTROL 1 MMOL/ML IV SOLN
7.0000 mL | Freq: Once | INTRAVENOUS | Status: AC | PRN
  Administered 2020-06-03: 7 mL via INTRAVENOUS

## 2020-06-04 NOTE — Progress Notes (Signed)
[place]   [phone]) [phone] [phone]   Clinic Follow up Note   Patient Care Team: Truitt Merle, MD as PCP - General (Hematology) Mansouraty, Telford Nab., MD as Consulting Physician (Gastroenterology)  Date of Service:  06/07/2020  CHIEF COMPLAINT: F/u of rectal cancer   SUMMARY OF ONCOLOGIC HISTORY: Oncology History Overview Note  Cancer Staging Rectal cancer [facility]) Staging form: Colon and Rectum, AJCC 8th Edition - Clinical stage from 05/11/2017: cT3, cN2, cM0 - Signed by Truitt Merle, MD on 08/25/2019    Rectal cancer ([place])  05/11/2017 Cancer Staging   Staging form: Colon and Rectum, AJCC 8th Edition - Clinical stage from 05/11/2017: cT3, cN2, cM0 - Signed by Truitt Merle, MD on 08/25/2019   05/11/2017 Procedure   Colonoscopy by Dr Crissie Reese at [facility] -Palpable rectal mass found on digital rectal exam  -Likely malignant tumor in the rectum. Biopsied.  -One 6 mm polyp in the sigmoid colon, removed with a cold snare, resected and retrieved.  -Diverticulosis at the hepatic flexure and in the ascending colon -the examined portion of the ileum was normal.    09/10/2017 Initial Biopsy   Final Diagnosis  1. Sigmoid Polyp biopsies:  Tubular Adenoma  2. Rectal Mass biopsies:  Moderately differentiated adenocarcinoma, invasive.  The depth of the invasion cannot be assessed in this biopsy specimen.     - 06/29/2017 Chemotherapy   She received neoadjuvant infusional 5FU with concurrent radiation.     09/17/2017 Procedure   Sigmoidoscopy by Dr. Tami Ribas at [facility] on 09/17/17  was noted to have complete response from chemoRT   10/31/2017 - 01/2018 Chemotherapy   Consolidation Chemo 5FU and Leucovorin. Oxaliplatin was deferred due to pre-existing neuropathy. Pt declined rectal surgery.    01/13/2019 Imaging   CT CAP W Contrast at [place] show persistent and stable 22mm nodule in the right middle lobe and stable  78mm nodule in the superior segment of the left lower lobe. No new or suspect nodules   Cholelithiasis without evidence of cholecystitis.   Mild compression of the superior plate of L5, Stable   Overall no evidence of disease recurrence in the chest, abdomen or pelvis.    08/25/2019 Initial Diagnosis   Rectal cancer ([place])   09/08/2019 Imaging   CT CAP W contrast 09/08/19  IMPRESSION: 1. Areas of mild rectal wall thickening, likely radiation change. No discrete rectal mass is identified. No perirectal or pelvic or retroperitoneal lymphadenopathy. 2. No findings suspicious for metastatic disease involving the liver or lungs. 3. Cholelithiasis.   10/07/2019 Procedure   Colonoscopy by Dr. Silvestre Moment 10/07/19 IMPRESSION - Hemorrhoids found on digital rectal exam. - The examined portion of the ileum was normal. - One 4 mm polyp in the ascending colon, removed with a cold snare. Resected and retrieved. - A single colonic angioectasia. - Scar in the distal rectum. - Diverticulosis in the recto-sigmoid colon, in the sigmoid colon, in the descending colon and at the hepatic flexure. - Normal mucosa in the entire examined colon otherwise. - Non-bleeding non-thrombosed external and internal hemorrhoids. Diagnosis Surgical [P], colon, ascending, polyp - BENIGN COLONIC MUCOSA WITH LYMPHOID AGGREGATE. - NO DYSPLASIA OR MALIGNANCY.   06/03/2020 Imaging   MRI Pelvis  IMPRESSION: No evidence of residual/recurrent or metastatic disease within the pelvis.      CURRENT THERAPY:  Surveillance   INTERVAL HISTORY:  [name] is here for a follow up of rectal cancer. She presents to the clinic alone.  She notes she is doing well. She notes her MRI went well but did not like the gel/contrast. 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 For her chronic back pain she uses Lidocaine patches 1-2 times a month and rarely uses  Tramadol. She notes getting monthly injection in her left eye due to prior Glaucoma surgery. She plans to get glasses to help her vision. She plans to repeat Flexible Sigmoidoscopy with Dr Rush Landmark. She notes she is losing more of her teeth now and wants to be seen by Dentist.  I sent her [facility] to set up her MyChart. Personally is has a boyfriend and her daughter has her [place] residency now.     REVIEW OF SYSTEMS:   Constitutional: Denies fevers, chills or abnormal weight loss Eyes: Denies blurriness of vision Ears, nose, mouth, throat, and face: Denies mucositis or sore throat Respiratory: Denies cough, dyspnea or wheezes Cardiovascular: Denies palpitation, chest discomfort or lower extremity swelling Gastrointestinal:  Denies nausea, heartburn or change in bowel habits Skin: Denies abnormal skin rashes Lymphatics: Denies new lymphadenopathy or easy bruising Neurological:Denies numbness, tingling or new weaknesses Behavioral/Psych: Mood is stable, no new changes  All other systems were reviewed with the patient and are negative.  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  ALLERGIES:  is allergic to morphine and related.  MEDICATIONS:  Current Outpatient Medications  Medication Sig Dispense Refill   diclofenac (CATAFLAM) 50 MG tablet Take 1 tablet (50 mg total) by mouth 3 (three) times daily. 20 tablet 1   lidocaine (LIDODERM) 5 % Place 1 patch onto  the skin daily. Remove & Discard patch within 12 hours or as directed by MD 20 patch 1   traMADol (ULTRAM) 50 MG tablet Take 50 mg by mouth every 6 (six) hours as needed (once a day).      valACYclovir (VALTREX) 1000 MG tablet Take 1,000 mg by mouth daily.     ketorolac (ACULAR) 0.5 % ophthalmic solution      ofloxacin (OCUFLOX) 0.3 % ophthalmic solution      prednisoLONE acetate (PRED FORTE) 1 % ophthalmic suspension      No current facility-administered medications for this visit.    PHYSICAL EXAMINATION: ECOG PERFORMANCE STATUS: 0 - Asymptomatic  Vitals:   06/07/20 1318 06/07/20 1320  BP: (!) 150/69 133/67  Pulse: 69   Resp: 18   Temp: (!) 97.5 F (36.4 C)   SpO2: 99%    Filed Weights   06/07/20 1318  Weight: 151 lb 3.2 oz (68.6 kg)    Due to COVID19 we will limit examination to appearance. Patient had no complaints.  GENERAL:alert, no distress and comfortable SKIN: skin color normal, no rashes or significant lesions EYES: normal, Conjunctiva are pink and non-injected, sclera clear  NEURO: alert & oriented x 3 with  fluent speech   LABORATORY DATA:  I have reviewed the data as listed CBC Latest Ref Rng & Units 06/07/2020 12/25/2019 09/08/2019  WBC 4.0 - 10.5 K/uL 6.0 5.5 5.5  Hemoglobin 12.0 - 15.0 g/dL 12.3 12.5 12.8  Hematocrit 36 - 46 % 37.2 37.9 38.0  Platelets 150 - 400 K/uL 210 145(L) 257     CMP Latest Ref Rng & Units 06/07/2020 12/25/2019 09/08/2019  Glucose 70 - 99 mg/dL 100(H) 103(H) 104(H)  BUN 8 - 23 mg/dL 15 12 10   Creatinine 0.44 - 1.00 mg/dL 0.73 0.78 0.74  Sodium 135 - 145 mmol/L 141 140 139  Potassium 3.5 - 5.1 mmol/L 3.9 4.1 4.1  Chloride 98 - 111 mmol/L 109 109 107  CO2 22 - 32 mmol/L 24 23 22   Calcium 8.9 - 10.3 mg/dL 9.7 10.1 10.1  Total Protein 6.5 - 8.1 g/dL 6.9 7.2 7.0  Total Bilirubin 0.3 - 1.2 mg/dL 0.5 0.5 0.3  Alkaline Phos 38 - 126 U/L 36(L) 32(L) 38  AST 15 - 41 U/L 13(L) 17 14(L)  ALT 0 - 44 U/L 8 13 11       RADIOGRAPHIC  STUDIES: I have personally reviewed the radiological images as listed and agreed with the findings in the report. No results found.   ASSESSMENT & PLAN:  Camielle Sizer is a 74 y.o. female with    1. Rectal Cancer, Stage IIIB  -Diagnosed on 05/11/17. She was treated with concurrent ChemoRT and consolidation chemo 5-FU/LV for 3 months.She declined surgery as she did not want colostomy bag. She is on surveillance. -Based on 09/17/17 Sigmoidoscopy she had complete response from chemoRT.Subsequent CT scan also showed no evidence of residual or metastatic disease. She has been doing well without surgery.  -Her 09/2019 colonoscopy with Dr. Rush Landmark showed scarring at rectum, hemorrhoids and 1 benign polyp.  -I personally reviewed and discussed her MRI Pelvis from 06/03/20 which shows no evidence of residual/recurrent or metastatic disease with in the pelvis.  -She is clinically doing well. Labs reviewed, CBC and CMP WNL. CEA still pending.  -Continue surveillance. Plan for next CT CAP before next visit. She will proceed with Sigmoidoscopy with Dr Rush Landmark in a few weeks  -Due to her teeth falling out, she plans to have all her teeth removed. I will refer her to Dr Enrique Sack especially since she is starting Prolia. I again recommend she f/u with her PCP for overall health management.  -F/u in 3 months with CT CAP for surveillance   2. Chronic back pain, Osteoporosis  -04/2018 DEXA showed osteoporosis. Per patient she hadDEXA in 05/2019 which showed Osteoporosis with T-score -3.5 -She was on Reclast yearly injections 05/22/18-05/2019.If our clinic does not have this will switch her to Prolia every 6 months starting in 06/07/20. She is agreeable.  -I will refer her to see Dr Mahlon Gammon to manage her dental health. I advised her to start Vit D daily and Calcium every other day (to due kidney stones). She is agreeable.  -She notes s/p surgery for her prior spinal Schwannoma she has had chronic  back pain. She uses Lidocaine patches 1-2 times a month for pain, Diclofenac for significant pain. Only for severe pain she will take Tramadol.  -She notes recent increase in b/l lower back pain. To better manage her pain I previously referred her to Dr. Maryjean Ka  3. Lung nodules -Stable on 01/13/19 CTscanshe has 10mm right lung and 40mm left lung nodules. -She has 26 years of very heavy smoking (1-5ppd).  She quit at age 22.  -Not seen on 08/2019 CT CAP. Likely benign and now resolved.    PLAN: -I refilled her Lidocaine patch today for her back pain  -Proceed with Prolia injection today, continue every 6 months.  -F/u in 3 months with lab and CT CAP w contrast a few days before  -dental referral, I provided Dr Ritta Slot number for her to contact.    No problem-specific Assessment & Plan notes found for this encounter.   Orders Placed This Encounter  Procedures   CT Abdomen Pelvis W Contrast    Standing Status:   Future    Standing Expiration Date:   06/07/2021    Order Specific Question:   If indicated for the ordered procedure, I authorize the administration of contrast media per Radiology protocol    Answer:   Yes    Order Specific Question:   Preferred imaging location?    Answer:   [facility]    Order Specific Question:   Release to patient    Answer:   Immediate    Order Specific Question:   Is Oral Contrast requested for this exam?    Answer:   Yes, Per Radiology protocol    Order Specific Question:   Radiology Contrast Protocol - do NOT remove file path    Answer:   \[email or web address]   CT Chest W Contrast    Standing Status:   Future    Standing Expiration Date:   06/07/2021    Order Specific Question:   If indicated for the ordered procedure, I authorize the administration of contrast media per Radiology protocol    Answer:   Yes    Order Specific Question:   Preferred imaging location?    Answer:   [facility]     Order Specific Question:   Radiology Contrast Protocol - do NOT remove file path    Answer:   \[email or web address]   Ambulatory referral to Dentistry    Referral Priority:   Routine    Referral Type:   Consultation    Referral Reason:   Specialty Services Required    Requested Specialty:   Dental General Practice    Number of Visits Requested:   1   All questions were answered. The patient knows to call the clinic with any problems, questions or concerns. No barriers to learning was detected. The total time spent in the appointment was 30 minutes.     Truitt Merle, MD 06/07/2020   I, Joslyn Devon, am acting as scribe for Truitt Merle, MD.   I have reviewed the above documentation for accuracy and completeness, and I agree with the above.

## 2020-06-07 ENCOUNTER — Encounter: Payer: Self-pay | Admitting: Hematology

## 2020-06-07 ENCOUNTER — Inpatient Hospital Stay (HOSPITAL_BASED_OUTPATIENT_CLINIC_OR_DEPARTMENT_OTHER): Payer: Medicaid Other | Admitting: Hematology

## 2020-06-07 ENCOUNTER — Inpatient Hospital Stay: Payer: Medicaid Other

## 2020-06-07 ENCOUNTER — Other Ambulatory Visit: Payer: Self-pay

## 2020-06-07 ENCOUNTER — Inpatient Hospital Stay: Payer: Medicaid Other | Attending: Hematology

## 2020-06-07 VITALS — BP 133/67 | HR 69 | Temp 97.5°F | Resp 18 | Ht 64.0 in | Wt 151.2 lb

## 2020-06-07 DIAGNOSIS — M810 Age-related osteoporosis without current pathological fracture: Secondary | ICD-10-CM | POA: Insufficient documentation

## 2020-06-07 DIAGNOSIS — Z95828 Presence of other vascular implants and grafts: Secondary | ICD-10-CM

## 2020-06-07 DIAGNOSIS — C20 Malignant neoplasm of rectum: Secondary | ICD-10-CM

## 2020-06-07 LAB — CBC WITH DIFFERENTIAL (CANCER CENTER ONLY)
Abs Immature Granulocytes: 0.02 10*3/uL (ref 0.00–0.07)
Basophils Absolute: 0 10*3/uL (ref 0.0–0.1)
Basophils Relative: 1 %
Eosinophils Absolute: 0.1 10*3/uL (ref 0.0–0.5)
Eosinophils Relative: 2 %
HCT: 37.2 % (ref 36.0–46.0)
Hemoglobin: 12.3 g/dL (ref 12.0–15.0)
Immature Granulocytes: 0 %
Lymphocytes Relative: 21 %
Lymphs Abs: 1.2 10*3/uL (ref 0.7–4.0)
MCH: 29.1 pg (ref 26.0–34.0)
MCHC: 33.1 g/dL (ref 30.0–36.0)
MCV: 88.2 fL (ref 80.0–100.0)
Monocytes Absolute: 0.5 10*3/uL (ref 0.1–1.0)
Monocytes Relative: 9 %
Neutro Abs: 4.1 10*3/uL (ref 1.7–7.7)
Neutrophils Relative %: 67 %
Platelet Count: 210 10*3/uL (ref 150–400)
RBC: 4.22 MIL/uL (ref 3.87–5.11)
RDW: 13.7 % (ref 11.5–15.5)
WBC Count: 6 10*3/uL (ref 4.0–10.5)
nRBC: 0 % (ref 0.0–0.2)

## 2020-06-07 LAB — CMP (CANCER CENTER ONLY)
ALT: 8 U/L (ref 0–44)
AST: 13 U/L — ABNORMAL LOW (ref 15–41)
Albumin: 3.9 g/dL (ref 3.5–5.0)
Alkaline Phosphatase: 36 U/L — ABNORMAL LOW (ref 38–126)
Anion gap: 8 (ref 5–15)
BUN: 15 mg/dL (ref 8–23)
CO2: 24 mmol/L (ref 22–32)
Calcium: 9.7 mg/dL (ref 8.9–10.3)
Chloride: 109 mmol/L (ref 98–111)
Creatinine: 0.73 mg/dL (ref 0.44–1.00)
GFR, Est AFR Am: >60 mL/min (ref >60)
GFR, Estimated: >60 mL/min (ref >60)
Glucose, Bld: 100 mg/dL — ABNORMAL HIGH (ref 70–99)
Potassium: 3.9 mmol/L (ref 3.5–5.1)
Sodium: 141 mmol/L (ref 135–145)
Total Bilirubin: 0.5 mg/dL (ref 0.3–1.2)
Total Protein: 6.9 g/dL (ref 6.5–8.1)

## 2020-06-07 LAB — CEA (IN HOUSE-CHCC): CEA (CHCC-In House): <1 ng/mL (ref 0.00–5.00)

## 2020-06-07 MED ORDER — LIDOCAINE 5 % EX PTCH: 1.0000 | MEDICATED_PATCH | CUTANEOUS | 1 refills | Status: DC

## 2020-06-07 MED ORDER — DENOSUMAB 60 MG/ML ~~LOC~~ SOSY
60.0000 mg | PREFILLED_SYRINGE | Freq: Once | SUBCUTANEOUS | Status: AC
  Administered 2020-06-07: 60 mg via SUBCUTANEOUS

## 2020-06-07 MED ORDER — SODIUM CHLORIDE 0.9% FLUSH
10.0000 mL | Freq: Once | INTRAVENOUS | Status: AC
  Administered 2020-06-07: 10 mL
  Filled 2020-06-07: qty 10

## 2020-06-07 MED ORDER — HEPARIN SOD (PORK) LOCK FLUSH 100 UNIT/ML IV SOLN
500.0000 [IU] | Freq: Once | INTRAVENOUS | Status: AC
  Administered 2020-06-07: 500 [IU]
  Filled 2020-06-07: qty 5

## 2020-06-08 ENCOUNTER — Telehealth: Payer: Self-pay | Admitting: Hematology

## 2020-06-08 NOTE — Telephone Encounter (Signed)
Scheduled per 7/19 los. Pt is aware of appts on 10/14 and 10/18. Pt requested to have her scan on Thursday 10/14.

## 2020-06-09 ENCOUNTER — Telehealth: Payer: Self-pay

## 2020-06-09 NOTE — Telephone Encounter (Signed)
[name] left vm stating she is scheduled for a flex sigmoidoscopy this week but she wants to cancel it.  She is asking Dr. Ernestina Penna input.

## 2020-06-09 NOTE — Telephone Encounter (Signed)
I called pt back, she will like to postpone her sigmoidoscopy (scheduled for 06/15/20) due to recent normal MRI. I discussed the difference between MRI and endoscopy, and encourage her to have endoscopy evaluation for cancer surveillance. I am OK if she wants to postpone it for 3 months, she is happy with that, and agreed to call Dr. Donneta Romberg office to reschedule her sigmoidoscopy.   Truitt Merle  06/09/2020

## 2020-06-09 NOTE — Telephone Encounter (Signed)
Noted the pt did call and cancel the procedure

## 2020-06-11 ENCOUNTER — Telehealth: Payer: Self-pay | Admitting: *Deleted

## 2020-06-11 NOTE — Telephone Encounter (Signed)
Prior Authorization request sent 06/09/2020 for Lidoderm 5% patch unfavorable.  Noted denied 06/10/2020 per [employer].  PA case: [id number]  New case submitted today for Generic Lidocaine 5% as neuropathic pain.  Preferred: Duloxetine capsule (Generic for Cymbalta) Gabapentin (Generic for Neurontin) Pregabalin capsule (Generic for Lyrica cap/sol)

## 2020-06-11 NOTE — Telephone Encounter (Signed)
Thank you for update. GM 

## 2020-06-15 ENCOUNTER — Other Ambulatory Visit: Payer: Medicaid Other | Admitting: Gastroenterology

## 2020-06-30 DIAGNOSIS — H34832 Tributary (branch) retinal vein occlusion, left eye, with macular edema: Secondary | ICD-10-CM | POA: Diagnosis not present

## 2020-07-07 DIAGNOSIS — M545 Low back pain: Secondary | ICD-10-CM | POA: Diagnosis not present

## 2020-07-07 DIAGNOSIS — D334 Benign neoplasm of spinal cord: Secondary | ICD-10-CM | POA: Diagnosis not present

## 2020-07-07 DIAGNOSIS — R030 Elevated blood-pressure reading, without diagnosis of hypertension: Secondary | ICD-10-CM | POA: Diagnosis not present

## 2020-07-07 DIAGNOSIS — D3610 Benign neoplasm of peripheral nerves and autonomic nervous system, unspecified: Secondary | ICD-10-CM | POA: Diagnosis not present

## 2020-07-07 DIAGNOSIS — G8929 Other chronic pain: Secondary | ICD-10-CM | POA: Diagnosis not present

## 2020-07-07 DIAGNOSIS — Z85038 Personal history of other malignant neoplasm of large intestine: Secondary | ICD-10-CM | POA: Diagnosis not present

## 2020-07-21 DIAGNOSIS — D334 Benign neoplasm of spinal cord: Secondary | ICD-10-CM | POA: Diagnosis not present

## 2020-07-21 DIAGNOSIS — M5126 Other intervertebral disc displacement, lumbar region: Secondary | ICD-10-CM | POA: Diagnosis not present

## 2020-07-27 DIAGNOSIS — M545 Low back pain: Secondary | ICD-10-CM | POA: Diagnosis not present

## 2020-07-28 DIAGNOSIS — H34832 Tributary (branch) retinal vein occlusion, left eye, with macular edema: Secondary | ICD-10-CM | POA: Diagnosis not present

## 2020-08-02 ENCOUNTER — Telehealth: Payer: Self-pay

## 2020-08-02 NOTE — Telephone Encounter (Signed)
[name] called stating she had an MRI of the spine this month.  She is going to the dentist and having xrays soon.  She is scheduled form CT scan AP in October.  She feels that she has had too much radiation this year and wants to postpone her Ct scan until sometime next year.

## 2020-08-03 ENCOUNTER — Telehealth: Payer: Self-pay | Admitting: Hematology

## 2020-08-03 ENCOUNTER — Telehealth: Payer: Self-pay

## 2020-08-03 NOTE — Telephone Encounter (Signed)
I spoke with Ms [name], per Dr. Burr Medico there is no radiation in the MRI and xrays have little radation.  Dr. Burr Medico recommends she get her Ct scan in October, but it is Ms [name] decision.  Dr. Burr Medico would like Ms [name] to keep her appt with her in October.  Ms [name] verbalized understanding.  I sent a scheduling message to move lab appt to 10/18 and add port flush appt.

## 2020-08-03 NOTE — Telephone Encounter (Signed)
R/s appt per 9/14 sch msg - pt aware of new appt date and time

## 2020-09-02 ENCOUNTER — Other Ambulatory Visit: Payer: Medicaid Other

## 2020-09-02 ENCOUNTER — Ambulatory Visit (HOSPITAL_COMMUNITY): Payer: Medicaid Other
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
## 2020-09-06 ENCOUNTER — Inpatient Hospital Stay: Payer: Medicaid Other | Attending: Hematology | Admitting: Hematology

## 2020-09-06 ENCOUNTER — Inpatient Hospital Stay: Payer: Medicaid Other

## 2020-09-06 ENCOUNTER — Encounter: Payer: Self-pay | Admitting: Hematology

## 2020-09-06 ENCOUNTER — Other Ambulatory Visit: Payer: Self-pay

## 2020-09-06 VITALS — BP 154/63 | HR 57 | Temp 97.0°F | Resp 18 | Ht 64.0 in | Wt 149.0 lb

## 2020-09-06 DIAGNOSIS — R918 Other nonspecific abnormal finding of lung field: Secondary | ICD-10-CM | POA: Insufficient documentation

## 2020-09-06 DIAGNOSIS — Z95828 Presence of other vascular implants and grafts: Secondary | ICD-10-CM

## 2020-09-06 DIAGNOSIS — M810 Age-related osteoporosis without current pathological fracture: Secondary | ICD-10-CM | POA: Diagnosis not present

## 2020-09-06 DIAGNOSIS — M545 Low back pain, unspecified: Secondary | ICD-10-CM | POA: Diagnosis not present

## 2020-09-06 DIAGNOSIS — M549 Dorsalgia, unspecified: Secondary | ICD-10-CM | POA: Insufficient documentation

## 2020-09-06 DIAGNOSIS — G8929 Other chronic pain: Secondary | ICD-10-CM | POA: Diagnosis not present

## 2020-09-06 DIAGNOSIS — M25562 Pain in left knee: Secondary | ICD-10-CM | POA: Diagnosis not present

## 2020-09-06 DIAGNOSIS — C20 Malignant neoplasm of rectum: Secondary | ICD-10-CM

## 2020-09-06 LAB — CBC WITH DIFFERENTIAL (CANCER CENTER ONLY)
Abs Immature Granulocytes: 0.01 10*3/uL (ref 0.00–0.07)
Basophils Absolute: 0 10*3/uL (ref 0.0–0.1)
Basophils Relative: 1 %
Eosinophils Absolute: 0.1 10*3/uL (ref 0.0–0.5)
Eosinophils Relative: 2 %
HCT: 37.2 % (ref 36.0–46.0)
Hemoglobin: 12.5 g/dL (ref 12.0–15.0)
Immature Granulocytes: 0 %
Lymphocytes Relative: 25 %
Lymphs Abs: 1.4 10*3/uL (ref 0.7–4.0)
MCH: 29.3 pg (ref 26.0–34.0)
MCHC: 33.6 g/dL (ref 30.0–36.0)
MCV: 87.3 fL (ref 80.0–100.0)
Monocytes Absolute: 0.5 10*3/uL (ref 0.1–1.0)
Monocytes Relative: 8 %
Neutro Abs: 3.5 10*3/uL (ref 1.7–7.7)
Neutrophils Relative %: 64 %
Platelet Count: 245 10*3/uL (ref 150–400)
RBC: 4.26 MIL/uL (ref 3.87–5.11)
RDW: 13.2 % (ref 11.5–15.5)
WBC Count: 5.4 10*3/uL (ref 4.0–10.5)
nRBC: 0 % (ref 0.0–0.2)

## 2020-09-06 LAB — CMP (CANCER CENTER ONLY)
ALT: 10 U/L (ref 0–44)
AST: 16 U/L (ref 15–41)
Albumin: 4 g/dL (ref 3.5–5.0)
Alkaline Phosphatase: 30 U/L — ABNORMAL LOW (ref 38–126)
Anion gap: 6 (ref 5–15)
BUN: 10 mg/dL (ref 8–23)
CO2: 25 mmol/L (ref 22–32)
Calcium: 10.5 mg/dL — ABNORMAL HIGH (ref 8.9–10.3)
Chloride: 106 mmol/L (ref 98–111)
Creatinine: 0.74 mg/dL (ref 0.44–1.00)
GFR, Estimated: >60 mL/min (ref >60)
Glucose, Bld: 92 mg/dL (ref 70–99)
Potassium: 4.1 mmol/L (ref 3.5–5.1)
Sodium: 137 mmol/L (ref 135–145)
Total Bilirubin: 0.6 mg/dL (ref 0.3–1.2)
Total Protein: 7 g/dL (ref 6.5–8.1)

## 2020-09-06 LAB — CEA (IN HOUSE-CHCC): CEA (CHCC-In House): <1 ng/mL (ref 0.00–5.00)

## 2020-09-06 MED ORDER — HEPARIN SOD (PORK) LOCK FLUSH 100 UNIT/ML IV SOLN
500.0000 [IU] | Freq: Once | INTRAVENOUS | Status: AC
  Administered 2020-09-06: 500 [IU]
  Filled 2020-09-06: qty 5

## 2020-09-06 MED ORDER — SODIUM CHLORIDE 0.9% FLUSH
10.0000 mL | Freq: Once | INTRAVENOUS | Status: AC
  Administered 2020-09-06: 10 mL
  Filled 2020-09-06: qty 10

## 2020-09-07 ENCOUNTER — Encounter: Payer: Self-pay | Admitting: Gastroenterology

## 2020-09-08 ENCOUNTER — Encounter: Payer: Self-pay | Admitting: Hematology

## 2020-09-15 ENCOUNTER — Ambulatory Visit (INDEPENDENT_AMBULATORY_CARE_PROVIDER_SITE_OTHER): Payer: Medicaid Other

## 2020-09-15 ENCOUNTER — Ambulatory Visit (INDEPENDENT_AMBULATORY_CARE_PROVIDER_SITE_OTHER): Payer: Medicaid Other | Admitting: Orthopedic Surgery

## 2020-09-15 ENCOUNTER — Encounter: Payer: Self-pay | Admitting: Orthopedic Surgery

## 2020-09-15 DIAGNOSIS — M79605 Pain in left leg: Secondary | ICD-10-CM | POA: Diagnosis not present

## 2020-09-15 NOTE — Progress Notes (Signed)
Office Visit Note   Patient: [name]           Date of Birth: 1946-01-01           MRN: [id number] Visit Date: 09/15/2020 Requested by: Truitt Merle, MD [place],  [place] [address] PCP: Truitt Merle, MD  Subjective: Chief Complaint  Patient presents with  . Left Knee - Pain  . Left Hip - Pain    HPI: Patient presents for evaluation of left knee pain and left hip pain.  Reports a lot of medial pain with swelling just below the joint line.  She has tried injections.  Denies any mechanical symptoms.  She has had symptoms for months.  Does not take much medication.  The pain is not necessarily worse with ambulation.  She does describe rest pain.  She does have osteoporosis with DEXA scan showing T score of -3.9 in the spine -2.5 in the femur and -3.3 in the femoral neck.  She is in remission for colon cancer.  Just moved here recently.  She also describes left hip pain which she localizes to the trochanteric and iliac crest region but not in the groin.  She is not using any assistive devices for ambulation.              ROS: All systems reviewed are negative as they relate to the chief complaint within the history of present illness.  Patient denies  fevers or chills.   Assessment & Plan: Visit Diagnoses:  1. Pain in left leg     Plan: Impression is left proximal medial pain with arthritis noted on plain radiographs and the potential for stress fracture versus stress reaction in the medial tibial plateau region.  Alternatively this could just be garden-variety arthritis which is becoming more symptomatic.  No evidence of bony metastatic disease in the appendicular skeleton radiographs today.  Plan is MRI scan to evaluate this medial stress fracture with possible alteration of weightbearing as a result.  We will see her back after that study.  Hip radiographs and exam today are benign for bony abnormalities and could reflect radicular pain from the back.  We can  work that up further after her's knee scan.  Follow-Up Instructions: Return for after MRI.   Orders:  Orders Placed This Encounter  Procedures  . XR HIP UNILAT W OR W/O PELVIS 2-3 VIEWS LEFT  . XR KNEE 3 VIEW LEFT  . MR Knee Left w/o contrast   No orders of the defined types were placed in this encounter.     Procedures: No procedures performed   Clinical Data: No additional findings.  Objective: Vital Signs: There were no vitals taken for this visit.  Physical Exam:   Constitutional: Patient appears well-developed HEENT:  Head: Normocephalic Eyes:EOM are normal Neck: Normal range of motion Cardiovascular: Normal rate Pulmonary/chest: Effort normal Neurologic: Patient is alert Skin: Skin is warm Psychiatric: Patient has normal mood and affect    Ortho Exam: Ortho exam demonstrates fairly focal and pronounced tenderness and focal swelling around the proximal medial tibial plateau.  Not as much at the joint line.  Pedal pulses palpable.  No groin pain on the left and right-hand side with internal X rotation leg.  No masses lymphadenopathy or skin changes noted in that knee or hip region either.  No trochanteric tenderness is present.  No nerve root tension signs.  Collateral and cruciate ligaments are stable in the left knee.  Specialty Comments:  No  specialty comments available.  Imaging: XR HIP UNILAT W OR W/O PELVIS 2-3 VIEWS LEFT  Result Date: 09/15/2020 AP pelvis lateral left hip reviewed.  No acute fracture.  Bone slightly osteopenic.  Joint space maintained in both hips.  Remainder bony pelvis normal.  Leg lengths equal.  XR KNEE 3 VIEW LEFT  Result Date: 09/15/2020 AP lateral merchant left knee reviewed.  No acute fracture.  There is joint space narrowing in the medial compartment.  Less so in the lateral compartment.  Patellofemoral compartment appears spared.  Slight varus alignment present.    PMFS History: Patient Active Problem List   Diagnosis  Date Noted  . Port-A-Cath in place 06/07/2020  . Osteoporosis 12/25/2019  . 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 Effusion of joint of right upper arm 11/29/2019  . Rectal cancer ([place]) 08/25/2019   Past Medical History:  Diagnosis Date  . Arthritis   . Cataract   . Colon cancer ([place])   . Kidney stones   . Macular degeneration   . Neuromuscular disorder ([place])   . Osteoporosis   . Rectal cancer ([place])   . 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 Cancer Mother        head and neck cancer   . Cancer Sister        lung cancer   . Cancer Brother        pancreatic cancer   . Pancreatic cancer Brother   . Cancer Paternal Grandmother        breast cancer   . Colon cancer Neg Hx   . Esophageal cancer Neg Hx   . Rectal cancer Neg Hx   . Stomach cancer Neg Hx   . Inflammatory bowel disease Neg Hx   . Liver disease Neg Hx     Past Surgical History:  Procedure Laterality Date  . ABDOMINAL HYSTERECTOMY    . COLONOSCOPY    . SPINE SURGERY  2017   schwannoma of spinal cord  . TONSILLECTOMY     Social History   Occupational History  . Not on file  Tobacco Use  . Smoking status: Former Smoker    Packs/day: 2.00    Years: 26.00    Pack years: 52.00    Quit date: 11/21/1987    Years since quitting: 32.8  . Smokeless tobacco: Never Used  Vaping Use  . Vaping Use: Never used  Substance and Sexual Activity  . Alcohol use: Yes    Comment: wine socially  . Drug use: Yes    Types: Marijuana    Comment: very rare use  . Sexual activity: Not on file

## 2020-10-05 ENCOUNTER — Telehealth: Payer: Self-pay | Admitting: *Deleted

## 2020-10-05 NOTE — Telephone Encounter (Signed)
Spoke with pt and she would to proceed with a flex sig.

## 2020-10-05 NOTE — Telephone Encounter (Signed)
Dr. Rush Landmark,  This pt was last seen by you in the office on 11-28-19.  She has a hx of rectal CA and you recommended she have a flex sig in May 2021 and colonoscopy in November this year.  She cancelled the flex sig in May and is rescheduled for 10-27-20.  I was just double checking to see if you would still like to do the flex sig or the colonoscopy.  Please advise  Thanks, Cyril Mourning

## 2020-10-05 NOTE — Telephone Encounter (Signed)
I am okay to move forward with a flexible sigmoidoscopy but a full colonoscopy is also okay from my standpoint and will give [place] the best opportunity to be clean.  So I will defer to the patient to decide how she would like to approach things. Thanks. GM

## 2020-10-12 ENCOUNTER — Other Ambulatory Visit: Payer: Self-pay

## 2020-10-12 ENCOUNTER — Ambulatory Visit (AMBULATORY_SURGERY_CENTER): Payer: Self-pay | Admitting: *Deleted

## 2020-10-12 VITALS — Ht 64.0 in | Wt 148.0 lb

## 2020-10-12 DIAGNOSIS — C20 Malignant neoplasm of rectum: Secondary | ICD-10-CM

## 2020-10-12 DIAGNOSIS — Z01818 Encounter for other preprocedural examination: Secondary | ICD-10-CM

## 2020-10-12 NOTE — Progress Notes (Signed)
Patient is here in-person for PV. Patient denies any allergies to eggs or soy. Patient denies any problems with anesthesia/sedation. Patient denies any oxygen use at home. Patient denies taking any diet/weight loss medications or blood thinners. Patient is not being treated for MRSA or C-diff. Patient is aware of our care-partner policy and [id number] safety protocol. COVID-19 test is on 12/6 at 9 am.

## 2020-10-25 ENCOUNTER — Other Ambulatory Visit: Payer: Self-pay | Admitting: Gastroenterology

## 2020-10-25 DIAGNOSIS — Z1159 Encounter for screening for other viral diseases: Secondary | ICD-10-CM | POA: Diagnosis not present

## 2020-10-25 LAB — SARS CORONAVIRUS 2 (TAT 6-24 HRS): SARS Coronavirus 2: NEGATIVE

## 2020-10-27 ENCOUNTER — Other Ambulatory Visit: Payer: Self-pay

## 2020-10-27 ENCOUNTER — Encounter: Payer: Self-pay | Admitting: Gastroenterology

## 2020-10-27 ENCOUNTER — Telehealth: Payer: Self-pay | Admitting: Gastroenterology

## 2020-10-27 ENCOUNTER — Ambulatory Visit (AMBULATORY_SURGERY_CENTER): Payer: Medicaid Other | Admitting: Gastroenterology

## 2020-10-27 VITALS — BP 148/76 | HR 69 | Temp 96.9°F | Resp 18 | Ht 64.0 in | Wt 148.0 lb

## 2020-10-27 DIAGNOSIS — Z85038 Personal history of other malignant neoplasm of large intestine: Secondary | ICD-10-CM | POA: Diagnosis not present

## 2020-10-27 DIAGNOSIS — D120 Benign neoplasm of cecum: Secondary | ICD-10-CM | POA: Diagnosis not present

## 2020-10-27 DIAGNOSIS — D122 Benign neoplasm of ascending colon: Secondary | ICD-10-CM

## 2020-10-27 DIAGNOSIS — C20 Malignant neoplasm of rectum: Secondary | ICD-10-CM | POA: Diagnosis not present

## 2020-10-27 MED ORDER — SODIUM CHLORIDE 0.9 % IV SOLN: 500.0000 mL | Freq: Once | INTRAVENOUS | Status: DC

## 2020-10-27 NOTE — Progress Notes (Signed)
No problems noted in the recovery room. maw 

## 2020-10-27 NOTE — Progress Notes (Signed)
VS- Randall Hiss RN Pt's states no medical or surgical changes since previsit or office visit.  Called to room to assist during endoscopic procedure.  Patient ID and intended procedure confirmed with present staff. Received instructions for my participation in the procedure from the performing physician.

## 2020-10-27 NOTE — Telephone Encounter (Signed)
Good morning!  Patient calling she is scheduled for procedure at 10:30am/Dr. Mansouraty (propofol flex sig). Patient states she is experiencing hemorrhoid pain. Patient states she cannot administered fleet enema at 8:30am.   Please advise, Thank you

## 2020-10-27 NOTE — Patient Instructions (Addendum)
Handouts were given to you on polyps, diverticulosis, a high fiber diet with a liberal amount of fluid, and hemorrhoids. Add Over the counter FIBERCON take 1-2 tablets by mouth daily.  Have a liberal amount of fluids with fiber supplement. You may resume your current medications today. Await biopsy results. Repeat Colonoscopy in 1 year for surveillance. Please call if any questions or concerns.     YOU HAD AN ENDOSCOPIC PROCEDURE TODAY AT [facility]:   Refer to the procedure report that was given to you for any specific questions about what was found during the examination.  If the procedure report does not answer your questions, please call your gastroenterologist to clarify.  If you requested that your care partner not be given the details of your procedure findings, then the procedure report has been included in a sealed envelope for you to review at your convenience later.  YOU SHOULD EXPECT: Some feelings of bloating in the abdomen. Passage of more gas than usual.  Walking can help get rid of the air that was put into your GI tract during the procedure and reduce the bloating. If you had a lower endoscopy (such as a colonoscopy or flexible sigmoidoscopy) you may notice spotting of blood in your stool or on the toilet paper. If you underwent a bowel prep for your procedure, you may not have a normal bowel movement for a few days.  Please Note:  You might notice some irritation and congestion in your nose or some drainage.  This is from the oxygen used during your procedure.  There is no need for concern and it should clear up in a day or so.  SYMPTOMS TO REPORT IMMEDIATELY:   Following lower endoscopy (colonoscopy or flexible sigmoidoscopy):  Excessive amounts of blood in the stool  Significant tenderness or worsening of abdominal pains  Swelling of the abdomen that is new, acute  Fever of 100F or higher   For urgent or emergent issues, a gastroenterologist can be  reached at any hour by calling [phone]. Do not use MyChart messaging for urgent concerns.    DIET:  We do recommend a small meal at first, but then you may proceed to your regular diet.  Drink plenty of fluids but you should avoid alcoholic beverages for 24 hours.  ACTIVITY:  You should plan to take it easy for the rest of today and you should NOT DRIVE or use heavy machinery until tomorrow (because of the sedation medicines used during the test).    FOLLOW UP: Our staff will call the number listed on your records 48-72 hours following your procedure to check on you and address any questions or concerns that you may have regarding the information given to you following your procedure. If we do not reach you, we will leave a message.  We will attempt to reach you two times.  During this call, we will ask if you have developed any symptoms of COVID 19. If you develop any symptoms (ie: fever, flu-like symptoms, shortness of breath, cough etc.) before then, please call [phone].  If you test positive for Covid 19 in the 2 weeks post procedure, please call and report this information to [place].    If any biopsies were taken you will be contacted by phone or by letter within the next 1-3 weeks.  Please call us at [phone] if you have not heard about the biopsies in 3 weeks.    SIGNATURES/CONFIDENTIALITY: You and/or your care partner  have signed paperwork which will be entered into your electronic medical record.  These signatures attest to the fact that that the information above on your After Visit Summary has been reviewed and is understood.  Full responsibility of the confidentiality of this discharge information lies with you and/or your care-partner.

## 2020-10-27 NOTE — Progress Notes (Signed)
A/ox3, pleased with MAC, report to RN 

## 2020-10-27 NOTE — Op Note (Addendum)
[place] Patient Name: [name] Procedure Date: 10/27/2020 9:51 AM MRN: [id number] Endoscopist: Justice Britain , MD Age: 74 Referring MD:  Date of Birth: 10-24-1946 Gender: Female Account #: [id number] Procedure:                Colonoscopy Indications:              High risk colon cancer surveillance: Personal                            history of rectal cancer Medicines:                Monitored Anesthesia Care Procedure:                Pre-Anesthesia Assessment:                           - Prior to the procedure, a History and Physical                            was performed, and patient medications and                            allergies were reviewed. The patient's tolerance of                            previous anesthesia was also reviewed. The risks                            and benefits of the procedure and the sedation                            options and risks were discussed with the patient.                            All questions were answered, and informed consent                            was obtained. Prior Anticoagulants: The patient has                            taken no previous anticoagulant or antiplatelet                            agents except for NSAID medication. ASA Grade                            Assessment: II - A patient with mild systemic                            disease. After reviewing the risks and benefits,                            the patient was deemed in satisfactory condition to  undergo the procedure.                           After obtaining informed consent, the colonoscope                            was passed under direct vision. Throughout the                            procedure, the patient's blood pressure, pulse, and                            oxygen saturations were monitored continuously. The                            Endoscope was introduced through the anus and                             advanced to the the cecum, identified by                            appendiceal orifice and ileocecal valve. After                            obtaining informed consent, the colonoscope was                            passed under direct vision. Throughout the                            procedure, the patient's blood pressure, pulse, and                            oxygen saturations were monitored continuously.The                            colonoscopy was performed without difficulty. The                            patient tolerated the procedure. The quality of the                            bowel preparation was adequate. The ileocecal                            valve, appendiceal orifice, and rectum were                            photographed. Scope In: 10:10:19 AM Scope Out: 10:31:53 AM Scope Withdrawal Time: 0 hours 16 minutes 6 seconds  Total Procedure Duration: 0 hours 21 minutes 34 seconds  Findings:                 The digital rectal exam findings include  hemorrhoids. Pertinent negatives include no                            palpable rectal lesions.                           Two sessile polyps were found in the ascending                            colon and cecum. The polyps were 2 to 3 mm in size.                           Normal mucosa was found in the entire colon.                           A few small-mouthed diverticula were found in the                            recto-sigmoid colon and sigmoid colon.                           Non-bleeding non-thrombosed external and internal                            hemorrhoids were found during retroflexion, during                            perianal exam and during digital exam. The                            hemorrhoids were Grade III (internal hemorrhoids                            that prolapse but require manual reduction). Complications:            No immediate  complications. Estimated Blood Loss:     Estimated blood loss was minimal. Impression:               - Hemorrhoids found on digital rectal exam.                           - Two 2 to 3 mm polyps in the ascending colon and                            in the cecum.                           - Normal mucosa in the entire examined colon.                           - Diverticulosis in the recto-sigmoid colon and in                            the sigmoid colon.                           -  Non-bleeding non-thrombosed external and internal                            hemorrhoids. Recommendation:           - The patient will be observed post-procedure,                            until all discharge criteria are met.                           - Discharge patient to home.                           - Patient has a contact number available for                            emergencies. The signs and symptoms of potential                            delayed complications were discussed with the                            patient. Return to normal activities tomorrow.                            Written discharge instructions were provided to the                            patient.                           - High fiber diet.                           - Use FiberCon 1-2 tablets PO daily.                           - Await pathology results.                           - Repeat colonoscopy in 1 year for surveillance as                            per prior discussion with Oncology in setting of                            previously treated rectal cancer.                           - The findings and recommendations were discussed                            with the patient.                           - The findings and recommendations were  discussed                            with the designated responsible adult. Justice Britain, MD 10/27/2020 10:45:33 AM

## 2020-10-29 ENCOUNTER — Telehealth: Payer: Self-pay | Admitting: *Deleted

## 2020-10-29 NOTE — Telephone Encounter (Signed)
  Follow up Call-  Call back number 10/27/2020 10/07/2019  Post procedure Call Back phone  # [phone]  Permission to leave phone message Yes Yes     Patient questions:  Do you have a fever, pain , or abdominal swelling? No. Pain Score  0 *  Have you tolerated food without any problems? Yes.    Have you been able to return to your normal activities? Yes.    Do you have any questions about your discharge instructions: Diet   No. Medications  No. Follow up visit  No.  Do you have questions or concerns about your Care? No.  Actions: * If pain score is 4 or above: No action needed, pain <4.  1. Have you developed a fever since your procedure? no  2.   Have you had an respiratory symptoms (SOB or cough) since your procedure? no  3.   Have you tested positive for COVID 19 since your procedure no  4.   Have you had any family members/close contacts diagnosed with the COVID 19 since your procedure?  no   If yes to any of these questions please route to Joylene John, RN and Joella Prince, RN

## 2020-10-30 ENCOUNTER — Other Ambulatory Visit: Payer: Self-pay

## 2020-10-30 ENCOUNTER — Ambulatory Visit (HOSPITAL_BASED_OUTPATIENT_CLINIC_OR_DEPARTMENT_OTHER)
Admission: RE | Admit: 2020-10-30 | Discharge: 2020-10-30 | Disposition: A | Discharge status: Home / Self Care | Payer: Medicaid Other | Source: Ambulatory Visit | Attending: Orthopedic Surgery | Admitting: Orthopedic Surgery

## 2020-10-30 DIAGNOSIS — M79605 Pain in left leg: Secondary | ICD-10-CM | POA: Diagnosis not present

## 2020-10-30 DIAGNOSIS — M25562 Pain in left knee: Secondary | ICD-10-CM | POA: Diagnosis not present

## 2020-11-03 ENCOUNTER — Telehealth: Payer: Self-pay

## 2020-11-03 ENCOUNTER — Encounter: Payer: Self-pay | Admitting: Gastroenterology

## 2020-11-03 ENCOUNTER — Ambulatory Visit (INDEPENDENT_AMBULATORY_CARE_PROVIDER_SITE_OTHER): Payer: Medicaid Other | Admitting: Orthopedic Surgery

## 2020-11-03 DIAGNOSIS — M79605 Pain in left leg: Secondary | ICD-10-CM | POA: Diagnosis not present

## 2020-11-03 DIAGNOSIS — M5410 Radiculopathy, site unspecified: Secondary | ICD-10-CM

## 2020-11-03 DIAGNOSIS — M1712 Unilateral primary osteoarthritis, left knee: Secondary | ICD-10-CM

## 2020-11-03 NOTE — Telephone Encounter (Signed)
Noted  

## 2020-11-03 NOTE — Telephone Encounter (Signed)
Need auth for left knee gel injection  

## 2020-11-04 ENCOUNTER — Other Ambulatory Visit: Payer: Self-pay

## 2020-11-05 ENCOUNTER — Encounter: Payer: Self-pay | Admitting: Family Medicine

## 2020-11-05 ENCOUNTER — Ambulatory Visit: Payer: Medicaid Other | Admitting: Family Medicine

## 2020-11-05 VITALS — BP 130/84 | HR 68 | Temp 98.3°F | Ht 63.5 in | Wt 148.6 lb

## 2020-11-05 DIAGNOSIS — Z7689 Persons encountering health services in other specified circumstances: Secondary | ICD-10-CM

## 2020-11-05 DIAGNOSIS — Z5320 Procedure and treatment not carried out because of patient's decision for unspecified reasons: Secondary | ICD-10-CM

## 2020-11-05 DIAGNOSIS — C20 Malignant neoplasm of rectum: Secondary | ICD-10-CM

## 2020-11-05 DIAGNOSIS — M810 Age-related osteoporosis without current pathological fracture: Secondary | ICD-10-CM

## 2020-11-05 NOTE — Progress Notes (Signed)
[name] is a 74 y.o. female  Chief Complaint  Patient presents with  . Establish Care    NP- establish care. C/o  having pain in the LT knee (had shot in it 2 days ago)  declines flu and covid vaccines.    HPI: [name] is a 74 y.o. female seen today as a new patient to establish care with our office. 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 She has 2 daughters. She is a professional wine taster and sommelier.   She has a h/o rectal cancer (3 years ago). She had colonoscopy earlier this month - Dr. Rush Landmark - and due for f/u in 1 year (10/2021) d/t history of rectal cancer.   Specialists: GI (Dr. Rush Landmark), oncology (Dr. Burr Medico), ortho (Dr. Marlou Sa)  Last mammo: decline mammo Last Dexa: 04/2018 - done at [facility] - osteoporosis (unsure of T-score - pt states it was -3.5)  Med refills needed today: none   Past Medical History:  Diagnosis Date  . Arthritis   . Cataract   . Colon cancer ([place])   . Kidney stones   . Macular degeneration   . Neuromuscular disorder ([place])   . Osteoporosis   . Rectal cancer ([place])   . 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 ABDOMINAL HYSTERECTOMY    . COLONOSCOPY  10/07/2019  . SPINE SURGERY  2017   schwannoma of spinal cord  . TONSILLECTOMY      Social History   Socioeconomic History  . Marital status: Divorced    Spouse name: Not on file  . Number of children: 1  . Years of education: Not on file  . Highest education level: Not on file  Occupational History  . Not on file  Tobacco Use  . Smoking status: Former Smoker    Packs/day: 2.00    Years: 26.00    Pack years: 52.00    Quit date: 11/21/1987    Years since quitting: 32.9  . Smokeless tobacco: Never Used  Vaping Use  . Vaping Use: Never used  Substance and Sexual Activity  . Alcohol use: Yes    Comment: wine socially  . Drug use: Yes    Types: Marijuana    Comment: very  rare use- 10-27-20- last use a year ago  . Sexual activity: Yes  Other Topics Concern  . Not on file  Social History Narrative  . Not on file   Social Determinants of Health   Financial Resource Strain: Not on file  Food Insecurity: Not on file  Transportation Needs: Not on file  Physical Activity: Not on file  Stress: Not on file  Social Connections: Not on file  Intimate Partner Violence: Not on file    Family History  Problem Relation Age of Onset  . Cancer Mother        head and neck cancer   . Cancer Sister        lung cancer   . Cancer Brother        pancreatic cancer   . Pancreatic cancer Brother   . Cancer Paternal Grandmother        breast cancer   . Colon cancer Neg Hx   . Esophageal cancer Neg Hx   . Rectal cancer Neg Hx   . Stomach cancer Neg Hx   . Inflammatory bowel disease Neg Hx   . Liver disease Neg Hx  There is no immunization history on file for this patient.  Outpatient Encounter Medications as of 11/05/2020  Medication Sig  . acetaminophen (TYLENOL) 500 MG tablet Take 500 mg by mouth every 6 (six) hours as needed.  . Aspirin-Acetaminophen-Caffeine (EXCEDRIN PO) Take by mouth as needed.  Marland Kitchen CALCIUM PO Take by mouth.  . Cholecalciferol (VITAMIN D3 PO) Take by mouth.  . diclofenac (CATAFLAM) 50 MG tablet Take 1 tablet (50 mg total) by mouth 3 (three) times daily.  Marland Kitchen lidocaine (LIDODERM) 5 % Place 1 patch onto the skin daily. Remove & Discard patch within 12 hours or as directed by MD  . traMADol (ULTRAM) 50 MG tablet Take 50 mg by mouth every 6 (six) hours as needed (once a day).   . valACYclovir (VALTREX) 1000 MG tablet Take 1,000 mg by mouth daily.  . [DISCONTINUED] ketorolac (ACULAR) 0.5 % ophthalmic solution  (Patient not taking: Reported on 10/12/2020)  . [DISCONTINUED] ofloxacin (OCUFLOX) 0.3 % ophthalmic solution  (Patient not taking: Reported on 10/12/2020)  . [DISCONTINUED] prednisoLONE acetate (PRED FORTE) 1 % ophthalmic suspension   (Patient not taking: Reported on 10/12/2020)   No facility-administered encounter medications on file as of 11/05/2020.     ROS: Pertinent positives and negatives noted in HPI. Remainder of ROS non-contributory   Allergies  Allergen Reactions  . Morphine And Related Anaphylaxis    BP 130/84   Pulse 68   Temp 98.3 F (36.8 C) (Temporal)   Ht 5' 3.5" (1.613 m)   Wt 148 lb 9.6 oz (67.4 kg)   SpO2 94%   BMI 25.91 kg/m   Physical Exam Constitutional:      General: She is not in acute distress.    Appearance: Normal appearance. She is not ill-appearing.  Cardiovascular:     Rate and Rhythm: Normal rate and regular rhythm.     Pulses: Normal pulses.  Pulmonary:     Effort: Pulmonary effort is normal. No respiratory distress.     Breath sounds: Normal breath sounds. No wheezing or rhonchi.  Musculoskeletal:     Right lower leg: No edema.     Left lower leg: No edema.  Neurological:     Mental Status: She is alert and oriented to person, place, and time.  Psychiatric:        Mood and Affect: Mood normal.        Behavior: Behavior normal.      A/P:  1. Encounter to establish care with new doctor - due for CPE with fasting labs  2. Rectal cancer ([place]) - treated 3 years ago and follows with Dr. Burr Medico. She had colonoscopy with Dr. Rush Landmark in 10/2019 and is due in 10/2021 - still has port in place  3. Osteoporosis, unspecified osteoporosis type, unspecified pathological fracture presence - last dexa in 2019 - T-score = -3.5 - pt is taking calcium 600mg  daily (she is afraid to take more d/t h/o kidney stones but she is unsure of what type of stones) and Vit D 1000IU daily - prolia in 05/2020. She had reclast infusion while in [place]. - [facility] Bone Density; Future  4. Mammogram declined - pt states she does not believe in these   This visit occurred during the SARS-CoV-2 public health emergency.  Safety protocols were in place, including screening questions prior to the  visit, additional usage of staff PPE, and extensive cleaning of exam room while observing appropriate contact time as indicated for disinfecting solutions.

## 2020-11-06 ENCOUNTER — Encounter: Payer: Self-pay | Admitting: Orthopedic Surgery

## 2020-11-06 NOTE — Progress Notes (Signed)
Office Visit Note   Patient: [name]           Date of Birth: Aug 03, 1946           MRN: [id number] Visit Date: 11/03/2020 Requested by: No referring provider defined for this encounter. PCP: Ronnald Nian, DO  Subjective: Chief Complaint  Patient presents with  . Other    Scan review    HPI: [name] is a 74 y.o. female who presents to the office complaining of left knee pain.  She returns for MRI review of the left knee.  MRI revealed complex tear involving the posterior horn and midbody regions of the medial meniscus with maceration as well as an adjacent para meniscal cyst, tricompartmental degenerative changes, worst in the medial compartment, intact ligamentous structures.  She notes continued left knee pain.  Denies any history of diabetes.  She also complains of bilateral buttocks pain that travels down her leg and laterally.  She has right radicular symptoms that travel down the posterior right leg into the posterior ankle.  Numbness of her bilateral feet that seems to be worsening.  Numbness extends to involve the toes of her left foot and involve the forefoot and midfoot of the right foot.  She has had worsening gait over the last year that "feels like walking drunk".  She has difficulty sitting due to pain.  Unable to sleep on her side on either side.  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                ROS: All systems reviewed are negative as they relate to the chief complaint within the history of present illness.  Patient denies fevers or chills.  Assessment & Plan: Visit Diagnoses:  1. Unilateral primary osteoarthritis, left knee   2. Pain in left leg   3. Radicular syndrome of right lower extremity   4. Radicular syndrome of left leg     Plan: Patient is a 74 year old female who presents for evaluation of left knee pain and radicular pain.  She has MRI  of the left knee that showed macerated meniscal tear with tricompartmental degenerative changes.  Nothing arthroscopically treatable on the MRI scan I would provide predictable relief.  Discussed options available to patient.  She would like to proceed with left knee cortisone injection.  She tolerated the cortisone injection well.  Plan to preapproved patient for gel injections for the left knee.  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 Laurence Spates for lumbar spine ESI's as patient's radicular pain seems to be worsening.  Follow-up as needed.  Follow-Up Instructions: No follow-ups on file.   Orders:  Orders Placed This Encounter  Procedures  . Ambulatory referral to Physical Medicine Rehab   No orders of the defined types were placed in this encounter.     Procedures: Large Joint Inj: L knee on 11/08/2020 12:12 AM Indications: diagnostic evaluation, joint swelling and pain Details: 18 G 1.5 in needle, superolateral approach  Arthrogram: No  Medications: 5 mL lidocaine 1 %; 40 mg methylPREDNISolone acetate 40 MG/ML; 4 mL bupivacaine 0.25 % Outcome: tolerated well, no immediate complications Procedure, treatment alternatives, risks and benefits explained, specific risks discussed. Consent was given by the patient. Immediately prior to procedure a time out was called to verify the correct patient, procedure, equipment,  support staff and site/side marked as required. Patient was prepped and draped in the usual sterile fashion.       Clinical Data: No additional findings.  Objective: Vital Signs: There were no vitals taken for this visit.  Physical Exam:  Constitutional: Patient appears well-developed HEENT:  Head: Normocephalic Eyes:EOM are normal Neck: Normal range of motion Cardiovascular: Normal rate Pulmonary/chest: Effort normal Neurologic: Patient is  alert Skin: Skin is warm Psychiatric: Patient has normal mood and affect  Ortho Exam: Ortho exam demonstrates no effusion of the left knee.  Tenderness throughout the medial and lateral joint lines, more so over the medial joint line.  Able to perform straight leg raise.  No pain with hip range of motion.  Tenderness over the bilateral greater trochanter.  5/5 motor strength of the bilateral hip flexors, quadricep, hamstring, dorsiflexion, plantarflexion.  Sensation intact through all dermatomes of the bilateral lower extremities aside from numbness throughout the left toes and numbness throughout the right foot.  Specialty Comments:  No specialty comments available.  Imaging: No results found.   PMFS History: Patient Active Problem List   Diagnosis Date Noted  . Port-A-Cath in place 06/07/2020  . Osteoporosis 12/25/2019  . 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 Effusion of joint of right upper arm 11/29/2019  . Rectal cancer ([place]) 08/25/2019   Past Medical History:  Diagnosis Date  . Arthritis   . Cataract   . Colon cancer ([place])   . Kidney stones   . Macular degeneration   . Neuromuscular disorder ([place])   . Osteoporosis   . Rectal cancer ([place])   . 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 Cancer Mother        head and neck cancer   . Cancer Sister        lung cancer   . Cancer Brother        pancreatic cancer   . Pancreatic cancer Brother   . Cancer Paternal Grandmother        breast cancer   . Colon cancer Neg Hx   . Esophageal cancer Neg Hx   . Rectal cancer Neg Hx   . Stomach cancer Neg Hx   . Inflammatory bowel disease Neg Hx   . Liver disease Neg Hx     Past Surgical History:  Procedure Laterality Date  . ABDOMINAL HYSTERECTOMY    . COLONOSCOPY  10/07/2019  . SPINE SURGERY  2017   schwannoma of spinal cord  . TONSILLECTOMY     Social History   Occupational History  . Not on file  Tobacco Use  . Smoking status: Former  Smoker    Packs/day: 2.00    Years: 26.00    Pack years: 52.00    Quit date: 11/21/1987    Years since quitting: 32.9  . Smokeless tobacco: Never Used  Vaping Use  . Vaping Use: Never used  Substance and Sexual Activity  . Alcohol use: Yes    Comment: wine socially  . Drug use: Yes    Types: Marijuana    Comment: very rare use- 10-27-20- last use a year ago  . Sexual activity: Yes

## 2020-11-08 DIAGNOSIS — M1712 Unilateral primary osteoarthritis, left knee: Secondary | ICD-10-CM | POA: Diagnosis not present

## 2020-11-08 DIAGNOSIS — M79605 Pain in left leg: Secondary | ICD-10-CM | POA: Diagnosis not present

## 2020-11-08 DIAGNOSIS — M5410 Radiculopathy, site unspecified: Secondary | ICD-10-CM | POA: Diagnosis not present

## 2020-11-08 MED ORDER — METHYLPREDNISOLONE ACETATE 40 MG/ML IJ SUSP
40.0000 mg | INTRAMUSCULAR | Status: AC | PRN
Start: 2020-11-08 — End: 2020-11-08
  Administered 2020-11-08: 40 mg via INTRA_ARTICULAR

## 2020-11-08 MED ORDER — LIDOCAINE HCL 1 % IJ SOLN
5.0000 mL | INTRAMUSCULAR | Status: AC | PRN
  Administered 2020-11-08: 5 mL

## 2020-11-08 MED ORDER — BUPIVACAINE HCL 0.25 % IJ SOLN
4.0000 mL | INTRAMUSCULAR | Status: AC | PRN
Start: 2020-11-08 — End: 2020-11-08
  Administered 2020-11-08: 4 mL via INTRA_ARTICULAR

## 2020-11-19 ENCOUNTER — Encounter: Payer: Self-pay | Admitting: Family Medicine

## 2020-11-24 ENCOUNTER — Telehealth: Payer: Self-pay

## 2020-11-24 ENCOUNTER — Other Ambulatory Visit (HOSPITAL_COMMUNITY): Payer: Medicaid Other

## 2020-11-24 ENCOUNTER — Other Ambulatory Visit (HOSPITAL_COMMUNITY)
Admission: RE | Admit: 2020-11-24 | Discharge: 2020-11-24 | Disposition: A | Discharge status: Home / Self Care | Payer: Medicaid Other | Source: Ambulatory Visit | Attending: Oral Surgery | Admitting: Oral Surgery

## 2020-11-24 DIAGNOSIS — Z20822 Contact with and (suspected) exposure to covid-19: Secondary | ICD-10-CM | POA: Insufficient documentation

## 2020-11-24 DIAGNOSIS — Z01812 Encounter for preprocedural laboratory examination: Secondary | ICD-10-CM | POA: Insufficient documentation

## 2020-11-24 LAB — SARS CORONAVIRUS 2 (TAT 6-24 HRS): SARS Coronavirus 2: NEGATIVE

## 2020-11-24 NOTE — H&P (Signed)
HISTORY AND PHYSICAL  [name] is a 75 y.o. female patient referred by general dentist for removal all remaining teeth due to dental caries.  No diagnosis found.  Past Medical History:  Diagnosis Date  . Arthritis   . Cataract   . Colon cancer (HCC)   . Kidney stones   . Macular degeneration   . Neuromuscular disorder (HCC)   . Osteoporosis   . Rectal cancer (HCC)   . 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HCC)     No current facility-administered medications for this encounter.   Current Outpatient Medications  Medication Sig Dispense Refill  . acetaminophen (TYLENOL) 500 MG tablet Take 500 mg by mouth every 6 (six) hours as needed for moderate pain or mild pain.    . calcium carbonate (OS-CAL) 600 MG TABS tablet Take 600 mg by mouth daily with breakfast.    . Cholecalciferol (VITAMIN D-3) 125 MCG (5000 UT) TABS Take 5,000 Units by mouth daily.    . polycarbophil (FIBERCON) 625 MG tablet Take 625 mg by mouth daily.    . valACYclovir (VALTREX) 1000 MG tablet Take 1,000 mg by mouth daily as needed (stress/ rash).    Marland Kitchen lidocaine (LIDODERM) 5 % Place 1 patch onto the skin daily. Remove & Discard patch within 12 hours or as directed by MD (Patient not taking: No sig reported) 20 patch 1   Allergies  Allergen Reactions  . Morphine And Related Anaphylaxis  . Other Other (See Comments)    Stitches reject   Active Problems:   * No active hospital problems. *  Vitals: There were no vitals taken for this visit. Lab results:No results found for this or any previous visit (from the past 24 hour(s)). Radiology Results: No results found. General appearance: alert, cooperative and no distress Head: Normocephalic, without obvious abnormality, atraumatic Eyes: negative Nose: Nares normal. Septum midline. Mucosa normal. No drainage or sinus tenderness. Throat: Multiple carious teeth. No purulence, edema, fluctuance, trismus. Pharynx clear. Neck: no adenopathy and supple, symmetrical,  trachea midline Resp: clear to auscultation bilaterally Cardio: regular rate and rhythm, S1, S2 normal, no murmur, click, rub or gallop  Assessment: Multiple non-restorable teeth secondary to dental caries.  Plan: Dental extractions with alveoloplasty. GA, Day surgery.   Ocie Doyne 11/24/2020

## 2020-11-24 NOTE — Telephone Encounter (Signed)
Mailed [employer] application to patients address listed in chart for [employer], left knee.  

## 2020-11-25 ENCOUNTER — Encounter: Payer: Self-pay | Admitting: Family Medicine

## 2020-11-25 ENCOUNTER — Encounter (HOSPITAL_COMMUNITY): Payer: Self-pay | Admitting: Oral Surgery

## 2020-11-25 NOTE — Progress Notes (Signed)
denies chest pain or shortness of breath. Patient  tested  Negative on 11/24/20 for Covid and has been in quarantine since that time.

## 2020-11-26 ENCOUNTER — Ambulatory Visit (HOSPITAL_COMMUNITY): Payer: Medicaid Other | Admitting: Certified Registered Nurse Anesthetist

## 2020-11-26 ENCOUNTER — Ambulatory Visit (HOSPITAL_COMMUNITY)
Admission: RE | Admit: 2020-11-26 | Discharge: 2020-11-26 | Disposition: A | Discharge status: Home / Self Care | Payer: Medicaid Other | Attending: Oral Surgery | Admitting: Oral Surgery

## 2020-11-26 ENCOUNTER — Encounter (HOSPITAL_COMMUNITY)
Admission: RE | Disposition: A | Discharge status: Home / Self Care | Payer: Self-pay | Source: Home / Self Care | Attending: Oral Surgery

## 2020-11-26 ENCOUNTER — Other Ambulatory Visit: Payer: Self-pay

## 2020-11-26 ENCOUNTER — Encounter (HOSPITAL_COMMUNITY): Payer: Self-pay | Admitting: Oral Surgery

## 2020-11-26 DIAGNOSIS — K8689 Other specified diseases of pancreas: Secondary | ICD-10-CM | POA: Diagnosis not present

## 2020-11-26 DIAGNOSIS — K029 Dental caries, unspecified: Secondary | ICD-10-CM | POA: Diagnosis not present

## 2020-11-26 DIAGNOSIS — K0530 Chronic periodontitis, unspecified: Secondary | ICD-10-CM | POA: Diagnosis not present

## 2020-11-26 DIAGNOSIS — Z885 Allergy status to narcotic agent status: Secondary | ICD-10-CM | POA: Insufficient documentation

## 2020-11-26 DIAGNOSIS — Z85048 Personal history of other malignant neoplasm of rectum, rectosigmoid junction, and anus: Secondary | ICD-10-CM | POA: Insufficient documentation

## 2020-11-26 DIAGNOSIS — Z86018 Personal history of other benign neoplasm: Secondary | ICD-10-CM | POA: Diagnosis not present

## 2020-11-26 DIAGNOSIS — Z87442 Personal history of urinary calculi: Secondary | ICD-10-CM | POA: Diagnosis not present

## 2020-11-26 DIAGNOSIS — K056 Periodontal disease, unspecified: Secondary | ICD-10-CM | POA: Diagnosis not present

## 2020-11-26 DIAGNOSIS — F419 Anxiety disorder, unspecified: Secondary | ICD-10-CM | POA: Diagnosis not present

## 2020-11-26 HISTORY — DX: Personal history of urinary calculi: Z87.442

## 2020-11-26 HISTORY — PX: TOOTH EXTRACTION: SHX859

## 2020-11-26 HISTORY — DX: Anxiety disorder, unspecified: F41.9

## 2020-11-26 SURGERY — DENTAL RESTORATION/EXTRACTIONS
Anesthesia: General

## 2020-11-26 MED ORDER — FENTANYL CITRATE (PF) 100 MCG/2ML IJ SOLN
25.0000 ug | INTRAMUSCULAR | Status: DC | PRN
  Administered 2020-11-26: 25 ug via INTRAVENOUS
  Administered 2020-11-26: 50 ug via INTRAVENOUS

## 2020-11-26 MED ORDER — ACETAMINOPHEN 500 MG PO TABS: 1000.0000 mg | ORAL_TABLET | Freq: Once | ORAL | Status: DC

## 2020-11-26 MED ORDER — ONDANSETRON HCL 4 MG/2ML IJ SOLN: 4.0000 mg | Freq: Once | INTRAMUSCULAR | Status: DC | PRN

## 2020-11-26 MED ORDER — ORAL CARE MOUTH RINSE: 15.0000 mL | Freq: Once | OROMUCOSAL | Status: AC

## 2020-11-26 MED ORDER — OXYMETAZOLINE HCL 0.05 % NA SOLN
NASAL | Status: AC
  Filled 2020-11-26: qty 30

## 2020-11-26 MED ORDER — DEXAMETHASONE SODIUM PHOSPHATE 10 MG/ML IJ SOLN
INTRAMUSCULAR | Status: DC | PRN
  Administered 2020-11-26: 5 mg via INTRAVENOUS

## 2020-11-26 MED ORDER — OXYCODONE-ACETAMINOPHEN 5-325 MG PO TABS
ORAL_TABLET | ORAL | Status: AC
  Filled 2020-11-26: qty 1

## 2020-11-26 MED ORDER — SUGAMMADEX SODIUM 200 MG/2ML IV SOLN
INTRAVENOUS | Status: DC | PRN
  Administered 2020-11-26: 200 mg via INTRAVENOUS
  Administered 2020-11-26: 50 mg via INTRAVENOUS

## 2020-11-26 MED ORDER — MIDAZOLAM HCL 5 MG/5ML IJ SOLN
INTRAMUSCULAR | Status: DC | PRN
  Administered 2020-11-26: 2 mg via INTRAVENOUS

## 2020-11-26 MED ORDER — LIDOCAINE 2% (20 MG/ML) 5 ML SYRINGE
INTRAMUSCULAR | Status: DC | PRN
  Administered 2020-11-26: 60 mg via INTRAVENOUS

## 2020-11-26 MED ORDER — LIDOCAINE-EPINEPHRINE 2 %-1:100000 IJ SOLN
INTRAMUSCULAR | Status: AC
  Filled 2020-11-26: qty 1

## 2020-11-26 MED ORDER — LIDOCAINE 2% (20 MG/ML) 5 ML SYRINGE
INTRAMUSCULAR | Status: AC
  Filled 2020-11-26: qty 5

## 2020-11-26 MED ORDER — LACTATED RINGERS IV SOLN: INTRAVENOUS | Status: DC | PRN

## 2020-11-26 MED ORDER — ONDANSETRON HCL 4 MG/2ML IJ SOLN
INTRAMUSCULAR | Status: DC | PRN
  Administered 2020-11-26: 4 mg via INTRAVENOUS

## 2020-11-26 MED ORDER — CHLORHEXIDINE GLUCONATE 0.12 % MT SOLN
OROMUCOSAL | Status: AC
  Administered 2020-11-26: 15 mL via OROMUCOSAL
  Filled 2020-11-26: qty 15

## 2020-11-26 MED ORDER — PROPOFOL 10 MG/ML IV BOLUS
INTRAVENOUS | Status: AC
  Filled 2020-11-26: qty 40

## 2020-11-26 MED ORDER — FENTANYL CITRATE (PF) 250 MCG/5ML IJ SOLN
INTRAMUSCULAR | Status: AC
  Filled 2020-11-26: qty 5

## 2020-11-26 MED ORDER — CHLORHEXIDINE GLUCONATE 0.12 % MT SOLN: 15.0000 mL | Freq: Once | OROMUCOSAL | Status: AC

## 2020-11-26 MED ORDER — OXYCODONE-ACETAMINOPHEN 5-325 MG PO TABS
1.0000 | ORAL_TABLET | ORAL | 0 refills | Status: DC | PRN
Start: 2020-11-26 — End: 2021-03-30

## 2020-11-26 MED ORDER — LACTATED RINGERS IV SOLN: INTRAVENOUS | Status: DC

## 2020-11-26 MED ORDER — DEXAMETHASONE SODIUM PHOSPHATE 10 MG/ML IJ SOLN
INTRAMUSCULAR | Status: AC
  Filled 2020-11-26: qty 1

## 2020-11-26 MED ORDER — ROCURONIUM BROMIDE 10 MG/ML (PF) SYRINGE
PREFILLED_SYRINGE | INTRAVENOUS | Status: AC
  Filled 2020-11-26: qty 10

## 2020-11-26 MED ORDER — CEFAZOLIN SODIUM-DEXTROSE 2-4 GM/100ML-% IV SOLN
2.0000 g | INTRAVENOUS | Status: AC
  Administered 2020-11-26: 2 g via INTRAVENOUS
  Filled 2020-11-26: qty 100

## 2020-11-26 MED ORDER — OXYMETAZOLINE HCL 0.05 % NA SOLN
NASAL | Status: DC | PRN
  Administered 2020-11-26: 3 via NASAL

## 2020-11-26 MED ORDER — PHENYLEPHRINE HCL (PRESSORS) 10 MG/ML IV SOLN
INTRAVENOUS | Status: DC | PRN
  Administered 2020-11-26: 40 ug via INTRAVENOUS

## 2020-11-26 MED ORDER — PROPOFOL 10 MG/ML IV BOLUS
INTRAVENOUS | Status: DC | PRN
  Administered 2020-11-26: 150 mg via INTRAVENOUS

## 2020-11-26 MED ORDER — SODIUM CHLORIDE 0.9 % IR SOLN
Status: DC | PRN
  Administered 2020-11-26: 1000 mL

## 2020-11-26 MED ORDER — AMOXICILLIN 500 MG PO CAPS: 500.0000 mg | ORAL_CAPSULE | Freq: Three times a day (TID) | ORAL | 0 refills | Status: DC

## 2020-11-26 MED ORDER — FENTANYL CITRATE (PF) 100 MCG/2ML IJ SOLN
INTRAMUSCULAR | Status: AC
  Filled 2020-11-26: qty 2

## 2020-11-26 MED ORDER — OXYCODONE-ACETAMINOPHEN 5-325 MG PO TABS
1.0000 | ORAL_TABLET | Freq: Once | ORAL | Status: AC
Start: 2020-11-26 — End: 2020-11-26
  Administered 2020-11-26: 1 via ORAL

## 2020-11-26 MED ORDER — ONDANSETRON HCL 4 MG/2ML IJ SOLN
INTRAMUSCULAR | Status: AC
  Filled 2020-11-26: qty 2

## 2020-11-26 MED ORDER — LIDOCAINE-EPINEPHRINE 2 %-1:100000 IJ SOLN
INTRAMUSCULAR | Status: DC | PRN
  Administered 2020-11-26: 20 mL via INTRADERMAL

## 2020-11-26 MED ORDER — FENTANYL CITRATE (PF) 250 MCG/5ML IJ SOLN
INTRAMUSCULAR | Status: DC | PRN
  Administered 2020-11-26: 50 ug via INTRAVENOUS
  Administered 2020-11-26: 100 ug via INTRAVENOUS

## 2020-11-26 MED ORDER — MIDAZOLAM HCL 2 MG/2ML IJ SOLN
INTRAMUSCULAR | Status: AC
  Filled 2020-11-26: qty 2

## 2020-11-26 MED ORDER — ROCURONIUM BROMIDE 100 MG/10ML IV SOLN
INTRAVENOUS | Status: DC | PRN
  Administered 2020-11-26: 50 mg via INTRAVENOUS

## 2020-11-26 SURGICAL SUPPLY — 37 items
BLADE SURG 15 STRL LF DISP TIS (BLADE) ×1 IMPLANT
BLADE SURG 15 STRL SS (BLADE) ×1
BUR CROSS CUT FISSURE 1.6 (BURR) ×2 IMPLANT
BUR EGG ELITE 4.0 (BURR) ×2 IMPLANT
CANISTER SUCT 3000ML PPV (MISCELLANEOUS) ×2 IMPLANT
COVER SURGICAL LIGHT HANDLE (MISCELLANEOUS) ×2 IMPLANT
COVER WAND RF STERILE (DRAPES) IMPLANT
DECANTER SPIKE VIAL GLASS SM (MISCELLANEOUS) ×2 IMPLANT
DRAPE U-SHAPE 76X120 STRL (DRAPES) ×2 IMPLANT
GAUZE PACKING FOLDED 2  STR (GAUZE/BANDAGES/DRESSINGS) ×1
GAUZE PACKING FOLDED 2 STR (GAUZE/BANDAGES/DRESSINGS) ×1 IMPLANT
GLOVE BIO SURGEON STRL SZ 6.5 (GLOVE) IMPLANT
GLOVE BIO SURGEON STRL SZ7 (GLOVE) IMPLANT
GLOVE BIO SURGEON STRL SZ8 (GLOVE) ×2 IMPLANT
GLOVE BIOGEL PI IND STRL 6.5 (GLOVE) IMPLANT
GLOVE BIOGEL PI IND STRL 7.0 (GLOVE) IMPLANT
GLOVE BIOGEL PI INDICATOR 6.5 (GLOVE)
GLOVE BIOGEL PI INDICATOR 7.0 (GLOVE)
GOWN STRL REUS W/ TWL LRG LVL3 (GOWN DISPOSABLE) ×1 IMPLANT
GOWN STRL REUS W/ TWL XL LVL3 (GOWN DISPOSABLE) ×1 IMPLANT
GOWN STRL REUS W/TWL LRG LVL3 (GOWN DISPOSABLE) ×1
GOWN STRL REUS W/TWL XL LVL3 (GOWN DISPOSABLE) ×1
IV NS 1000ML (IV SOLUTION) ×1
IV NS 1000ML BAXH (IV SOLUTION) ×1 IMPLANT
KIT BASIN OR (CUSTOM PROCEDURE TRAY) ×2 IMPLANT
KIT TURNOVER KIT B (KITS) ×2 IMPLANT
NDL HYPO 25GX1X1/2 BEV (NEEDLE) ×2 IMPLANT
NEEDLE HYPO 25GX1X1/2 BEV (NEEDLE) ×4 IMPLANT
NS IRRIG 1000ML POUR BTL (IV SOLUTION) ×2 IMPLANT
PAD ARMBOARD 7.5X6 YLW CONV (MISCELLANEOUS) ×2 IMPLANT
SLEEVE IRRIGATION ELITE 7 (MISCELLANEOUS) ×2 IMPLANT
SPONGE SURGIFOAM ABS GEL 12-7 (HEMOSTASIS) IMPLANT
SUT CHROMIC 3 0 PS 2 (SUTURE) ×2 IMPLANT
SYR CONTROL 10ML LL (SYRINGE) ×2 IMPLANT
TRAY ENT MC OR (CUSTOM PROCEDURE TRAY) ×2 IMPLANT
TUBING IRRIGATION (MISCELLANEOUS) ×2 IMPLANT
YANKAUER SUCT BULB TIP NO VENT (SUCTIONS) ×2 IMPLANT

## 2020-11-26 NOTE — Anesthesia Procedure Notes (Signed)
Procedure Name: Intubation Date/Time: 11/26/2020 8:05 AM Performed by: Janene Harvey, CRNA Pre-anesthesia Checklist: Patient identified, Emergency Drugs available, Suction available and Patient being monitored Patient Re-evaluated:Patient Re-evaluated prior to induction Oxygen Delivery Method: Circle system utilized Preoxygenation: Pre-oxygenation with 100% oxygen Induction Type: IV induction Ventilation: Mask ventilation without difficulty Laryngoscope Size: Glidescope and 3 Grade View: Grade I Tube type: Oral Nasal Tubes: Left, Nasal prep performed and Nasal Rae Tube size: 7.0 mm Number of attempts: 1 Airway Equipment and Method: Stylet and Oral airway Placement Confirmation: ETT inserted through vocal cords under direct vision,  positive ETCO2 and breath sounds checked- equal and bilateral Secured at: 26 cm Tube secured with: Tape Dental Injury: Teeth and Oropharynx as per pre-operative assessment

## 2020-11-26 NOTE — H&P (Signed)
H&P documentation  -History and Physical Reviewed  -Patient has been re-examined  -No change in the plan of care  [name]  

## 2020-11-26 NOTE — Op Note (Signed)
[name], [name] MEDICAL RECORD IR:[id number] ACCOUNT [id number] DATE OF BIRTH:1946/07/31 FACILITY: [facility] LOCATION: [facility] PHYSICIAN:Zelig Gacek M. Undine Nealis, DDS  OPERATIVE REPORT  DATE OF PROCEDURE:  11/26/2020  PREOPERATIVE DIAGNOSES:  Nonrestorable teeth secondary to dental caries and periodontitis, numbers 2, 4, 5, 6, 7, 8, 9, 10, 11, 12, 14, 18, 20, 21, 22, 23, 24, 26, 27, 28.  POSTOPERATIVE DIAGNOSES:  Nonrestorable teeth secondary to dental caries and periodontitis, numbers 2, 4, 5, 6, 7, 8, 9, 10, 11, 12, 14, 18, 20, 21, 22, 23, 24, 26, 27, 28 plus hyperplastic right maxillary osseous tuberosity.  PROCEDURE:  Dental extraction of teeth numbers 2, 4, 5, 6, 7, 8, 9, 10, 11, 12, 14, 18, 20, 21, 22, 23, 24, 25, 26, 27, 28, alveoplasty right and left maxilla and mandible and right maxillary osseous tuberosity reduction.  SURGEON:  Diona Browner, DDS  ANESTHESIA:  General nasal intubation.  Dr. Doroteo Glassman, attending.  DESCRIPTION OF PROCEDURE:  The patient was taken to the operating room and placed on the table in supine position.  General anesthesia was administered.  A nasal endotracheal tube was placed and secured.  The eyes were protected.  The patient was draped  for surgery.  A timeout was performed.  The posterior pharynx was suctioned and a throat pack was placed, 2% lidocaine and 1:100,000 epinephrine was infiltrated in an inferior alveolar block on the right and left sides and in buccal and palatal  infiltration in the maxilla.  A bite block was placed on the right side of the mouth and a sweetheart retractor was used to retract the tongue.  A #15 blade was used to make an incision around tooth #18 in the gingival sulcus.  It was carried forward  around the alveolar crest to tooth #20 and then the incision was made in the buccal and lingual gingival sulcus across the midline to tooth #27.  The periosteum was reflected from around these teeth.  The teeth were elevated with the  301 elevator and  then removed with the dental forceps.  The sockets were curetted.  The tissue was trimmed to allow for primary closure.  The tissue was reflected to expose the alveolar ridge which was irregular in contour and required smoothing, then the egg-shaped bur  under irrigation was used to perform an alveoplasty of the left mandibular alveolus.  The bone file was then used to smooth the area.  Then, the area was irrigated and closed with 3-0 chromic.  Then, the left maxilla was operated.  A #15 blade was used  to make an incision around tooth #14 in the gingival sulcus, carried forward to tooth #12 where a buccal and palatal gingival sulcal incision was made from tooth numbers 12, 11, 10, 9, 8, 7 and 6.  The periosteum was reflected from around these teeth.   The teeth were elevated with a 301 elevator and removed from the mouth with the dental forceps.  Then, the sockets were curetted.  The tissue was trimmed to allow for primary closure and reflected to expose the alveolar crest.  There were multiple  undercuts owing to buccal bone growth in the buccal part of the ridge, in the canine region, the alveolar bone was smoothed and reduced to remove undercuts using the egg-shaped bur and then the bone file was used to further smooth the area.  Then, the  area was irrigated and closed with 3-0 chromic.  The bite block and sweetheart retractor were repositioned to the other side of the  mouth.  A 15 blade was used to make an incision around teeth numbers 26, 27 and 28 in the mandible and then around teeth  numbers 2, 4, 5 and 6 in the maxilla.  The periosteum was reflected from around these teeth.  The teeth were elevated with a 301 elevator.  Teeth numbers 26 and 27 were removed with the dental forceps.  Tooth #28 fractured upon attempted removal.  The  periosteum was reflected further and bone was removed around the root and then the root fragment was removed with the 301 elevator and hemostat.  Then,  in the maxilla, teeth numbers 2, 4, 5 were removed using the 301 elevator and the dental forceps.  The  sockets were curetted in the right maxilla and mandible, tissue was trimmed to allow for closure and the tissue was reflected to expose the alveolar crest in the mandible.  The crest was irregular as there was small buccal exostoses on the superior  portion of the anterior labial plate and these fractured upon removal of bone to smooth the area, so the bone required further trimming with the egg bur to allow for a smooth contour.  Then, a bone file was used to further smooth this area.  In the  maxilla, the bone was prominent in the premolar and molar region with undercuts and these were smoothed with the egg bur followed by the bone file.  Then, the area of the right maxilla and right mandible were irrigated and closed with 3-0 chromic.  The  anterior tissue in the midline of the maxilla showed frenum that had been lowered as a result of suturing of the incision, so a frenectomy was performed using scissors and the area was sutured with 3-0 chromic.  Then, the oral cavity was irrigated and  suctioned.  Throat pack was removed.  The patient was left in care of anesthesia for extubation and transported to recovery room with plans for discharge home through day surgery.  ESTIMATED BLOOD LOSS:  Minimum.  COMPLICATIONS:  None.  SPECIMENS:  None.  HN/NUANCE  D:11/26/2020 T:11/26/2020 JOB:[id number]

## 2020-11-26 NOTE — Op Note (Signed)
11/26/2020  9:12 AM  PATIENT:  [name]  75 y.o. female  PRE-OPERATIVE DIAGNOSIS:  NON RESTORABLE TEETH # 2, 4, 5, 6, 7, 8, 9, 10, 11, 12, 14, 18, 20, 21, 22, 23, 24, 26, 27, 28  POST-OPERATIVE DIAGNOSIS:  SAME+ HYPERPLASTIC RIGHT MAXILLARY TUBEROSITY  PROCEDURE:  Procedure(s): DENTAL EXTRACTION TEETH # 2, 4, 5, 6, 7, 8, 9, 10, 11, 12, 14, 18, 20, 21, 22, 23, 24, 26, 27, 28, ALVEOLOPLASTY RIGHT AND LEFT MAXILLA AND MANDIBLE, RIGHT MAXILLARY OSSEOUS TUBEROSITY REDUCTION   SURGEON:  Surgeon(s): Diona Browner, DMD  ANESTHESIA:   local and general  EBL:  minimal  DRAINS: none   SPECIMEN:  No Specimen  COUNTS:  YES  PLAN OF CARE: Discharge to home after PACU  PATIENT DISPOSITION:  PACU - hemodynamically stable.   PROCEDURE DETAILS: Dictation # [id number]  Gae Bon, DMD 11/26/2020 9:12 AM

## 2020-11-26 NOTE — Anesthesia Postprocedure Evaluation (Signed)
Anesthesia Post Note  Patient: Gordon Carlson  Procedure(s) Performed: DENTAL RESTORATION/EXTRACTIONS (N/A )     Patient location during evaluation: PACU Anesthesia Type: General Level of consciousness: awake and alert, oriented and patient cooperative Pain management: pain level controlled Vital Signs Assessment: post-procedure vital signs reviewed and stable Respiratory status: spontaneous breathing, nonlabored ventilation and respiratory function stable Cardiovascular status: blood pressure returned to baseline and stable Postop Assessment: no apparent nausea or vomiting Anesthetic complications: no Comments: Hypertensive at baseline with no formal diagnosis of HTN, d/w patient that she needs to follow up with PCP regarding blood pressure   No complications documented.  Last Vitals:  Vitals:   11/26/20 1025 11/26/20 1040  BP: (!) 169/78 (!) 164/73  Pulse: 87 68  Resp: 19 11  Temp:    SpO2: 94% 94%    Last Pain:  Vitals:   11/26/20 1040  TempSrc:   PainSc: [place]

## 2020-11-26 NOTE — Transfer of Care (Signed)
Immediate Anesthesia Transfer of Care Note  Patient: [name]  Procedure(s) Performed: DENTAL RESTORATION/EXTRACTIONS (N/A )  Patient Location: PACU  Anesthesia Type:General  Level of Consciousness: drowsy and patient cooperative  Airway & Oxygen Therapy: Patient Spontanous Breathing and Patient connected to face mask oxygen  Post-op Assessment: Report given to RN and Post -op Vital signs reviewed and stable  Post vital signs: Reviewed  Last Vitals:  Vitals Value Taken Time  BP 152/70 11/26/20 0925  Temp    Pulse 78 11/26/20 0927  Resp 19 11/26/20 0927  SpO2 98 % 11/26/20 0927  Vitals shown include unvalidated device data.  Last Pain:  Vitals:   11/26/20 0655  TempSrc:   PainSc: 3       Patients Stated Pain Goal: 3 (00/86/76 1950)  Complications: No complications documented.

## 2020-11-26 NOTE — Anesthesia Preprocedure Evaluation (Addendum)
Anesthesia Evaluation  Patient identified by MRN, date of birth, ID band Patient awake    Reviewed: Allergy & Precautions, NPO status , Patient's Chart, lab work & pertinent test results  Airway Mallampati: II  TM Distance: >3 FB Neck ROM: Full    Dental  (+) Poor Dentition, Dental Advisory Given   Pulmonary former smoker,  Quit smoking 1989, 46 pack year history    Pulmonary exam normal breath sounds clear to auscultation       Cardiovascular negative cardio ROS Normal cardiovascular exam Rhythm:Regular Rate:Normal     Neuro/Psych PSYCHIATRIC DISORDERS Anxiety Schwannoma s/p spine surgery 2017    GI/Hepatic negative GI ROS, Neg liver ROS,   Endo/Other  negative endocrine ROS  Renal/GU negative Renal ROS  negative genitourinary   Musculoskeletal  (+) Arthritis , Osteoarthritis,    Abdominal   Peds  Hematology negative hematology ROS (+)   Anesthesia Other Findings Dental caries   Reproductive/Obstetrics negative OB ROS                            Anesthesia Physical Anesthesia Plan  ASA: II  Anesthesia Plan: General   Post-op Pain Management:    Induction: Intravenous  PONV Risk Score and Plan: 3 and Ondansetron, Dexamethasone and Treatment may vary due to age or medical condition  Airway Management Planned: Nasal ETT  Additional Equipment: None  Intra-op Plan:   Post-operative Plan: Extubation in OR  Informed Consent: I have reviewed the patients History and Physical, chart, labs and discussed the procedure including the risks, benefits and alternatives for the proposed anesthesia with the patient or authorized representative who has indicated his/her understanding and acceptance.     Dental advisory given  Plan Discussed with: CRNA  Anesthesia Plan Comments:         Anesthesia Quick Evaluation

## 2020-11-27 ENCOUNTER — Encounter (HOSPITAL_COMMUNITY): Payer: Self-pay | Admitting: Oral Surgery

## 2020-12-01 ENCOUNTER — Encounter: Payer: Self-pay | Admitting: Family Medicine

## 2020-12-07 ENCOUNTER — Other Ambulatory Visit: Payer: Medicaid Other

## 2021-01-12 IMAGING — MR MR KNEE*L* W/O CM
9 series · 40 of 40 positions shown · non-contrast
Comparison: Radiographs 09/15/2020

CLINICAL DATA: Left knee pain.

EXAM:
MRI OF THE LEFT KNEE WITHOUT CONTRAST
TECHNIQUE: Multiplanar, multisequence MR imaging of the knee was performed. No
intravenous contrast was administered.

[Series 3: T2 fat-sat · axial · 4.0mm · 0.62mm/px · z∈[-62,+53]mm · 5 of 24 slices shown (1 of 4)]
[im 1/24]
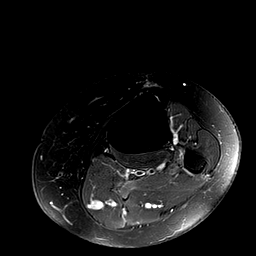
[im 6/24]
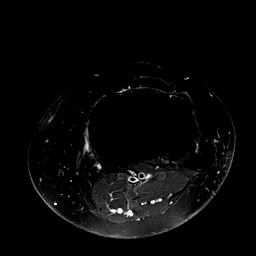
[im 12/24]
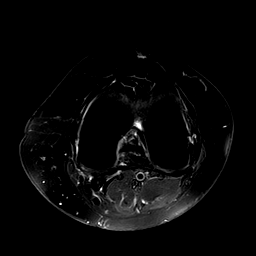
[im 18/24]
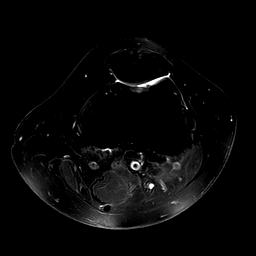
[im 24/24]
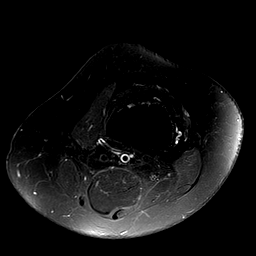

[Series 4: T1 · coronal · 4.0mm · 0.59mm/px · 5 of 24 slices shown]
[im 1/24]
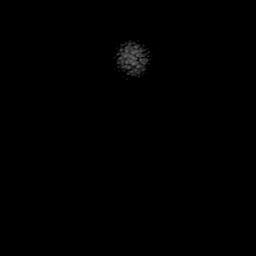
[im 6/24]
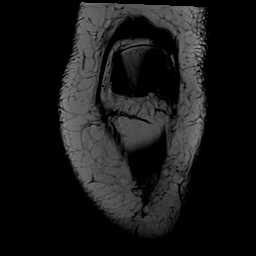
[im 12/24]
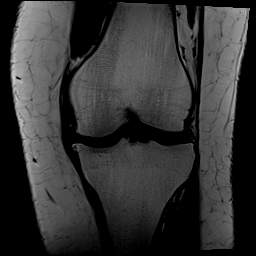
[im 18/24]
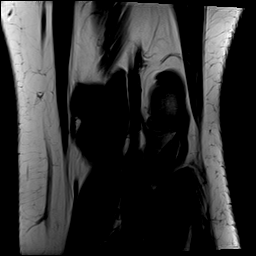
[im 24/24]
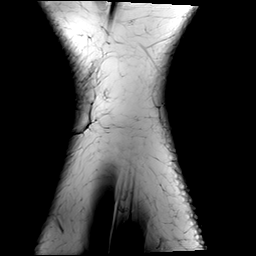

[Series 5: T2 fat-sat · coronal · 4.0mm · 0.59mm/px · 5 of 24 slices shown (2 of 4)]
[im 1/24]
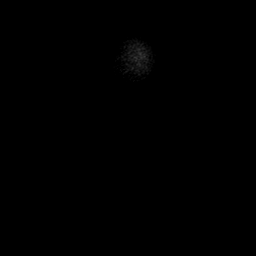
[im 6/24]
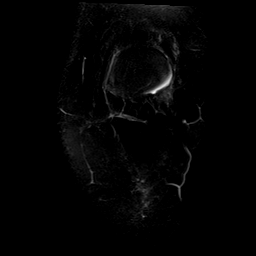
[im 12/24]
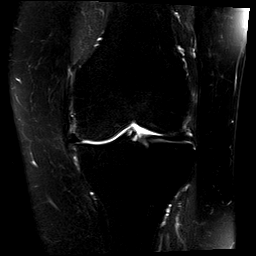
[im 18/24]
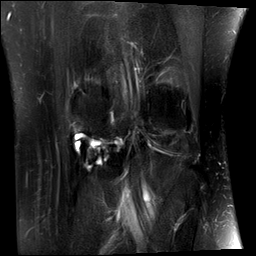
[im 24/24]
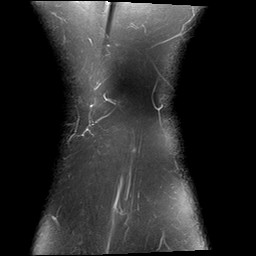

[Series 6: PD fat-sat · coronal · 3.0mm · 0.59mm/px · 5 of 28 slices shown (1 of 2)]
[im 1/28]
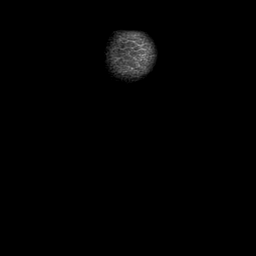
[im 7/28]
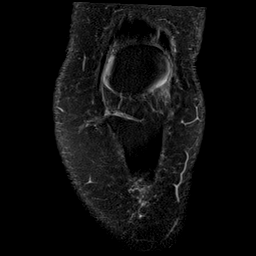
[im 14/28]
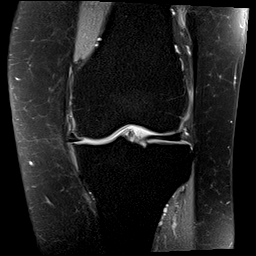
[im 21/28]
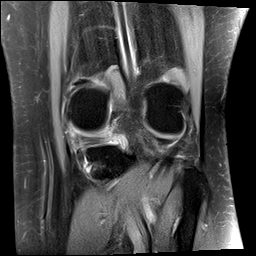
[im 28/28]
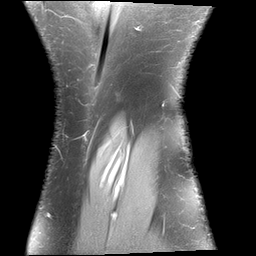

[Series 7: PD fat-sat · sagittal · 3.0mm · 0.59mm/px · 5 of 28 slices shown (2 of 2)]
[im 1/28]
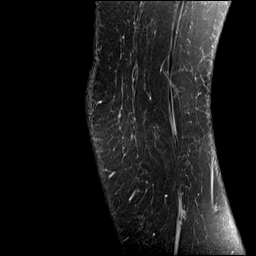
[im 7/28]
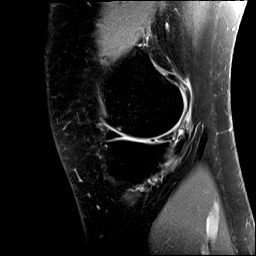
[im 14/28]
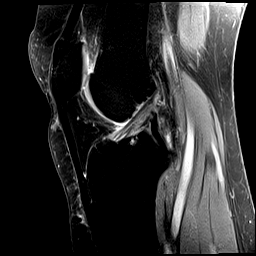
[im 21/28]
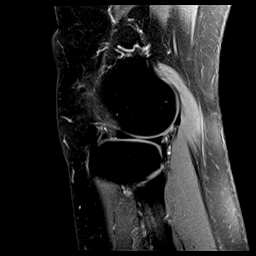
[im 28/28]
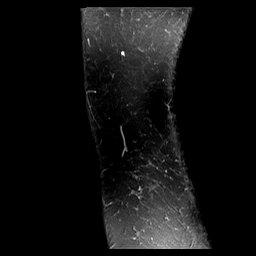

[Series 8: T2 fat-sat · sagittal · 3.0mm · 0.59mm/px · 5 of 28 slices shown (3 of 4)]
[im 1/28]
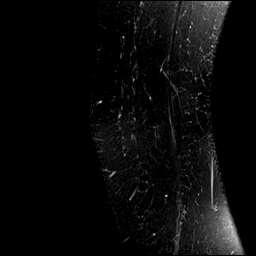
[im 7/28]
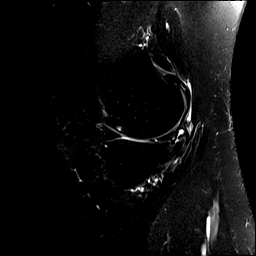
[im 14/28]
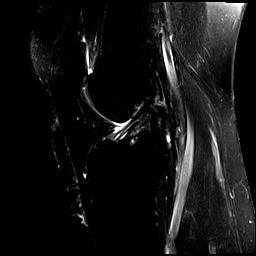
[im 21/28]
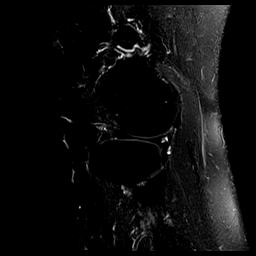
[im 28/28]
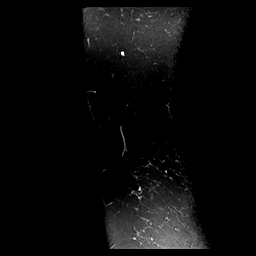

[Series 9: PD · coronal · 2.0mm · 0.47mm/px · 4 of 18 slices shown]
[im 1/18]
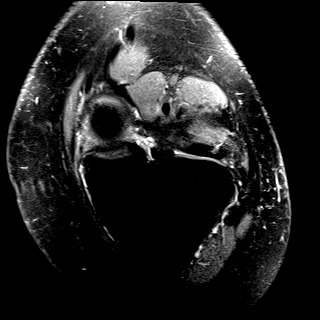
[im 6/18]
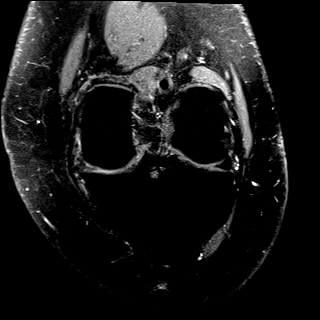
[im 12/18]
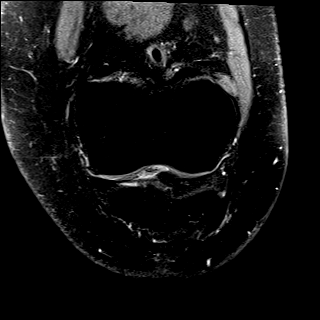
[im 18/18]
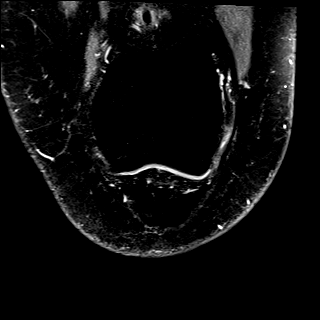

[Series 10: T2 fat-sat · coronal · 4.0mm · 0.59mm/px · 5 of 24 slices shown (4 of 4)]
[im 1/24]
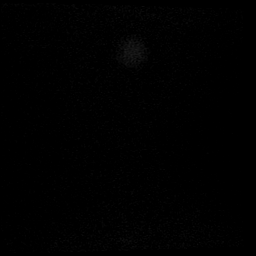
[im 6/24]
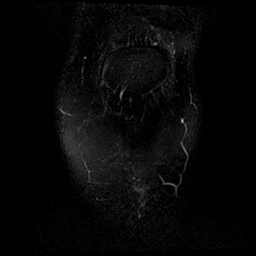
[im 12/24]
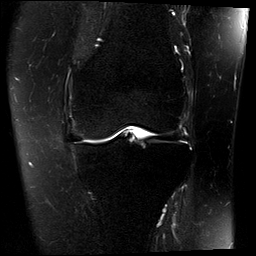
[im 18/24]
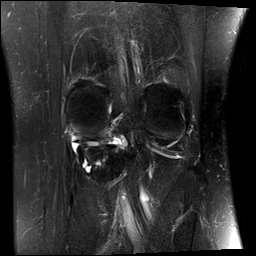
[im 24/24]
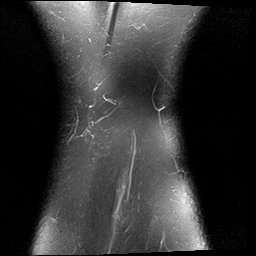

[Series 100: hx · axial · 8.0mm · 0.68mm/px · 1 of 6 slices shown]
[im 1/6]
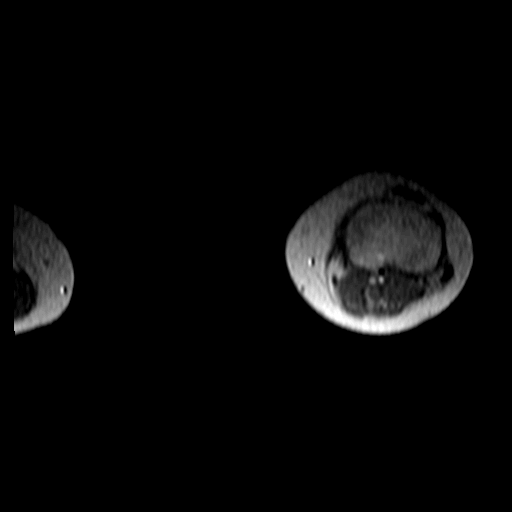

[40 of 40 positions shown; findings below may reference images not displayed]

FINDINGS: MENISCI

Medial meniscus: The posterior horn and midbody regions are
degenerated and torn. Complex tear with a macerated appearance.
There is also an adjacent parameniscal cyst measuring a maximum 13
mm.

Lateral meniscus:  Intact

LIGAMENTS

Cruciates:  Intact.

Collaterals:  Intact.  Mild MCL and pes anserine bursitis.

CARTILAGE

Patellofemoral: Degenerative chondrosis with a focus of full or near
full-thickness cartilage loss involving the lateral facet. This
measures approximately 8 mm.

Medial: Advanced degenerative chondrosis with areas of full or near
full-thickness cartilage loss, joint space narrowing and spurring.

Lateral: Moderate degenerative chondrosis with moderate cartilage
thinning, early joint space narrowing and spurring.

Joint:  Small joint effusion.

Popliteal Fossa:  Small Baker's cyst.

Extensor Mechanism: The patella retinacular structures are intact
and the quadriceps and patellar tendons are intact.

Bones:  No acute bony findings.

Other: Unremarkable knee musculature.
IMPRESSION: 1. Complex tear involving the posterior horn and midbody regions of
the medial meniscus with a macerated appearance. There is also an
adjacent parameniscal cyst.
2. Intact ligamentous structures and no acute bony findings.
3. Tricompartmental degenerative changes most significant in the
medial compartment.
4. Small joint effusion and small Baker's cyst.

## 2021-02-03 ENCOUNTER — Inpatient Hospital Stay: Payer: Medicaid Other | Attending: Hematology

## 2021-02-03 ENCOUNTER — Encounter (HOSPITAL_COMMUNITY): Payer: Self-pay

## 2021-02-03 ENCOUNTER — Other Ambulatory Visit: Payer: Self-pay

## 2021-02-03 ENCOUNTER — Ambulatory Visit (HOSPITAL_COMMUNITY)
Admission: RE | Admit: 2021-02-03 | Discharge: 2021-02-03 | Disposition: A | Discharge status: Home / Self Care | Payer: Medicaid Other | Source: Ambulatory Visit | Attending: Hematology | Admitting: Hematology

## 2021-02-03 DIAGNOSIS — K8020 Calculus of gallbladder without cholecystitis without obstruction: Secondary | ICD-10-CM | POA: Diagnosis not present

## 2021-02-03 DIAGNOSIS — M810 Age-related osteoporosis without current pathological fracture: Secondary | ICD-10-CM | POA: Diagnosis not present

## 2021-02-03 DIAGNOSIS — C20 Malignant neoplasm of rectum: Secondary | ICD-10-CM | POA: Insufficient documentation

## 2021-02-03 DIAGNOSIS — K760 Fatty (change of) liver, not elsewhere classified: Secondary | ICD-10-CM | POA: Diagnosis not present

## 2021-02-03 DIAGNOSIS — E041 Nontoxic single thyroid nodule: Secondary | ICD-10-CM | POA: Diagnosis not present

## 2021-02-03 DIAGNOSIS — N281 Cyst of kidney, acquired: Secondary | ICD-10-CM | POA: Diagnosis not present

## 2021-02-03 LAB — CMP (CANCER CENTER ONLY)
ALT: 10 U/L (ref 0–44)
AST: 15 U/L (ref 15–41)
Albumin: 4.4 g/dL (ref 3.5–5.0)
Alkaline Phosphatase: 36 U/L — ABNORMAL LOW (ref 38–126)
Anion gap: 9 (ref 5–15)
BUN: 12 mg/dL (ref 8–23)
CO2: 27 mmol/L (ref 22–32)
Calcium: 10.3 mg/dL (ref 8.9–10.3)
Chloride: 106 mmol/L (ref 98–111)
Creatinine: 0.81 mg/dL (ref 0.44–1.00)
GFR, Estimated: >60 mL/min (ref >60)
Glucose, Bld: 98 mg/dL (ref 70–99)
Potassium: 4.1 mmol/L (ref 3.5–5.1)
Sodium: 142 mmol/L (ref 135–145)
Total Bilirubin: 0.5 mg/dL (ref 0.3–1.2)
Total Protein: 7.5 g/dL (ref 6.5–8.1)

## 2021-02-03 LAB — CBC WITH DIFFERENTIAL (CANCER CENTER ONLY)
Abs Immature Granulocytes: 0.01 10*3/uL (ref 0.00–0.07)
Basophils Absolute: 0 10*3/uL (ref 0.0–0.1)
Basophils Relative: 1 %
Eosinophils Absolute: 0.1 10*3/uL (ref 0.0–0.5)
Eosinophils Relative: 3 %
HCT: 38.7 % (ref 36.0–46.0)
Hemoglobin: 12.9 g/dL (ref 12.0–15.0)
Immature Granulocytes: 0 %
Lymphocytes Relative: 27 %
Lymphs Abs: 1.5 10*3/uL (ref 0.7–4.0)
MCH: 29.1 pg (ref 26.0–34.0)
MCHC: 33.3 g/dL (ref 30.0–36.0)
MCV: 87.2 fL (ref 80.0–100.0)
Monocytes Absolute: 0.4 10*3/uL (ref 0.1–1.0)
Monocytes Relative: 8 %
Neutro Abs: 3.4 10*3/uL (ref 1.7–7.7)
Neutrophils Relative %: 61 %
Platelet Count: 265 10*3/uL (ref 150–400)
RBC: 4.44 MIL/uL (ref 3.87–5.11)
RDW: 12.8 % (ref 11.5–15.5)
WBC Count: 5.5 10*3/uL (ref 4.0–10.5)
nRBC: 0 % (ref 0.0–0.2)

## 2021-02-03 LAB — CEA (IN HOUSE-CHCC): CEA (CHCC-In House): <1 ng/mL (ref 0.00–5.00)

## 2021-02-03 MED ORDER — IOHEXOL 300 MG/ML  SOLN
100.0000 mL | Freq: Once | INTRAMUSCULAR | Status: AC | PRN
  Administered 2021-02-03: 100 mL via INTRAVENOUS
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
## 2021-02-07 ENCOUNTER — Telehealth: Payer: Self-pay | Admitting: Hematology

## 2021-02-07 ENCOUNTER — Other Ambulatory Visit: Payer: Self-pay

## 2021-02-07 ENCOUNTER — Inpatient Hospital Stay (HOSPITAL_BASED_OUTPATIENT_CLINIC_OR_DEPARTMENT_OTHER): Payer: Medicaid Other | Admitting: Hematology

## 2021-02-07 ENCOUNTER — Encounter: Payer: Self-pay | Admitting: Hematology

## 2021-02-07 VITALS — BP 145/70 | HR 78 | Temp 98.3°F | Resp 18 | Ht 63.5 in | Wt 146.3 lb

## 2021-02-07 DIAGNOSIS — C20 Malignant neoplasm of rectum: Secondary | ICD-10-CM | POA: Diagnosis not present

## 2021-02-07 DIAGNOSIS — M810 Age-related osteoporosis without current pathological fracture: Secondary | ICD-10-CM | POA: Diagnosis not present

## 2021-02-07 NOTE — Telephone Encounter (Signed)
Scheduled per 03/21 los, patient received updated calender.

## 2021-02-18 ENCOUNTER — Ambulatory Visit: Payer: Medicaid Other | Admitting: Nurse Practitioner

## 2021-02-18 ENCOUNTER — Encounter: Payer: Self-pay | Admitting: Nurse Practitioner

## 2021-02-18 ENCOUNTER — Other Ambulatory Visit: Payer: Self-pay

## 2021-02-18 VITALS — BP 120/70 | HR 74 | Temp 97.0°F | Ht 63.5 in | Wt 144.2 lb

## 2021-02-18 DIAGNOSIS — G8929 Other chronic pain: Secondary | ICD-10-CM | POA: Diagnosis not present

## 2021-02-18 DIAGNOSIS — M25551 Pain in right hip: Secondary | ICD-10-CM

## 2021-02-18 DIAGNOSIS — M160 Bilateral primary osteoarthritis of hip: Secondary | ICD-10-CM

## 2021-02-18 DIAGNOSIS — M25552 Pain in left hip: Secondary | ICD-10-CM

## 2021-02-18 DIAGNOSIS — M5137 Other intervertebral disc degeneration, lumbosacral region: Secondary | ICD-10-CM

## 2021-02-18 MED ORDER — MELOXICAM 7.5 MG PO TABS: 7.5000 mg | ORAL_TABLET | Freq: Every day | ORAL | 5 refills | Status: DC

## 2021-02-18 NOTE — Progress Notes (Signed)
Subjective:  Patient ID: [name], female    DOB: 10-Nov-1946  Age: 75 y.o. MRN: [id number]  CC: Acute Visit (Pt c/o bilateral leg and hip pain with numbness in her hips/legs at night x 2 months.)  Hip Pain  The incident occurred more than 1 week ago (chronic hip and back pain for several years, but worsening). There was no injury mechanism. The pain is present in the left hip and right hip. The quality of the pain is described as aching and cramping. The pain is severe. The pain has been constant since onset. Associated symptoms include numbness. Pertinent negatives include no inability to bear weight, loss of motion, loss of sensation, muscle weakness or tingling. She reports no foreign bodies present. Exacerbated by: laying down and change in weather. She has tried nothing for the symptoms.   Reviewed past Medical, Social and Family history today.  Outpatient Medications Prior to Visit  Medication Sig Dispense Refill  . acetaminophen (TYLENOL) 500 MG tablet Take 500 mg by mouth every 6 (six) hours as needed for moderate pain or mild pain.    . calcium carbonate (OS-CAL) 600 MG TABS tablet Take 600 mg by mouth daily with breakfast.    . Cholecalciferol (VITAMIN D-3) 125 MCG (5000 UT) TABS Take 5,000 Units by mouth daily.    . polycarbophil (FIBERCON) 625 MG tablet Take 625 mg by mouth daily.    Marland Kitchen amoxicillin (AMOXIL) 500 MG capsule Take 1 capsule (500 mg total) by mouth 3 (three) times daily. (Patient not taking: Reported on 02/18/2021) 21 capsule 0  . oxyCODONE-acetaminophen (PERCOCET) 5-325 MG tablet Take 1 tablet by mouth every 4 (four) hours as needed. (Patient not taking: Reported on 02/18/2021) 30 tablet 0  . valACYclovir (VALTREX) 1000 MG tablet Take 1,000 mg by mouth daily as needed (stress/ rash). (Patient not taking: Reported on 02/18/2021)     No facility-administered medications prior to visit.    ROS See HPI  Objective:  BP 120/70 (BP Location: Left Arm, Patient  Position: Sitting, Cuff Size: Normal)   Pulse 74   Temp (!) 97 F (36.1 C) (Temporal)   Ht 5' 3.5" (1.613 m)   Wt 144 lb 3.2 oz (65.4 kg)   SpO2 97%   BMI 25.14 kg/m   Physical Exam Musculoskeletal:     Lumbar back: No tenderness. Normal range of motion. Negative right straight leg raise test and negative left straight leg raise test.     Right hip: Tenderness present. No crepitus. Normal range of motion. Normal strength.     Left hip: Tenderness present. No crepitus. Normal range of motion. Normal strength.     Right upper leg: Normal.     Left upper leg: Normal.     Right knee: Normal.     Left knee: Normal.     Right lower leg: Normal.     Left lower leg: Normal.     Right ankle: Normal.     Left ankle: Normal.     Right foot: Normal.     Left foot: Normal.    Assessment & Plan:  This visit occurred during the SARS-CoV-2 public health emergency.  Safety protocols were in place, including screening questions prior to the visit, additional usage of staff PPE, and extensive cleaning of exam room while observing appropriate contact time as indicated for disinfecting solutions.   Jayli was seen today for acute visit.  Diagnoses and all orders for this visit:  Osteoarthritis of both hips, unspecified osteoarthritis  type -     meloxicam (MOBIC) 7.5 MG tablet; Take 1 tablet (7.5 mg total) by mouth daily. With food  Chronic pain of both hips -     meloxicam (MOBIC) 7.5 MG tablet; Take 1 tablet (7.5 mg total) by mouth daily. With food  DDD (degenerative disc disease), lumbosacral -     meloxicam (MOBIC) 7.5 MG tablet; Take 1 tablet (7.5 mg total) by mouth daily. With food  Ok to take tylenol/acetaminophen 500mg  every 8hrs and/or meloxicam 7.5mf once a day for pain. Start daily walking and stretching exercise. Provided printed copy of hip and back exercises.  Problem List Items Addressed This Visit   None   Visit Diagnoses    Osteoarthritis of both hips, unspecified  osteoarthritis type    -  Primary   Relevant Medications   meloxicam (MOBIC) 7.5 MG tablet   Chronic pain of both hips       Relevant Medications   meloxicam (MOBIC) 7.5 MG tablet   DDD (degenerative disc disease), lumbosacral       Relevant Medications   meloxicam (MOBIC) 7.5 MG tablet      Follow-up: No follow-ups on file.  Wilfred Lacy, NP

## 2021-02-18 NOTE — Patient Instructions (Signed)
Ok to take tylenol/acetaminophen 500mg  every 8hrs and/or meloxicam 7.18mf once a day for pain. Start daily walking and stretching exercise.  Hip Exercises Ask your health care provider which exercises are safe for you. Do exercises exactly as told by your health care provider and adjust them as directed. It is normal to feel mild stretching, pulling, tightness, or discomfort as you do these exercises. Stop right away if you feel sudden pain or your pain gets worse. Do not begin these exercises until told by your health care provider. Stretching and range-of-motion exercises These exercises warm up your muscles and joints and improve the movement and flexibility of your hip. These exercises also help to relieve pain, numbness, and tingling. You may be asked to limit your range of motion if you had a hip replacement. Talk to your health care provider about these restrictions. Hamstrings, supine 1. Lie on your back (supine position). 2. Loop a belt or towel over the ball of your left / right foot. The ball of your foot is on the walking surface, right under your toes. 3. Straighten your left / right knee and slowly pull on the belt or towel to raise your leg until you feel a gentle stretch behind your knee (hamstring). ? Do not let your knee bend while you do this. ? Keep your other leg flat on the floor. 4. Hold this position for __________ seconds. 5. Slowly return your leg to the starting position. Repeat __________ times. Complete this exercise __________ times a day.   Hip rotation 1. Lie on your back on a firm surface. 2. With your left / right hand, gently pull your left / right knee toward the shoulder that is on the same side of the body. Stop when your knee is pointing toward the ceiling. 3. Hold your left / right ankle with your other hand. 4. Keeping your knee steady, gently pull your left / right ankle toward your other shoulder until you feel a stretch in your buttocks. ? Keep your  hips and shoulders firmly planted while you do this stretch. 5. Hold this position for __________ seconds. Repeat __________ times. Complete this exercise __________ times a day.   Seated stretch This exercise is sometimes called hamstrings and adductors stretch. 1. Sit on the floor with your legs stretched wide. Keep your knees straight during this exercise. 2. Keeping your head and back in a straight line, bend at your waist to reach for your left foot (position A). You should feel a stretch in your right inner thigh (adductors). 3. Hold this position for __________ seconds. Then slowly return to the upright position. 4. Keeping your head and back in a straight line, bend at your waist to reach forward (position B). You should feel a stretch behind both of your thighs and knees (hamstrings). 5. Hold this position for __________ seconds. Then slowly return to the upright position. 6. Keeping your head and back in a straight line, bend at your waist to reach for your right foot (position C). You should feel a stretch in your left inner thigh (adductors). 7. Hold this position for __________ seconds. Then slowly return to the upright position. Repeat __________ times. Complete this exercise __________ times a day.   Lunge This exercise stretches the muscles of the hip (hip flexors). 1. Place your left / right knee on the floor and bend your other knee so that is directly over your ankle. You should be half-kneeling. 2. Keep good posture with your head  over your shoulders. 3. Tighten your buttocks to point your tailbone downward. This will prevent your back from arching too much. 4. You should feel a gentle stretch in the front of your left / right thigh and hip. If you do not feel a stretch, slide your other foot forward slightly and then slowly lunge forward with your chest up until your knee once again lines up over your ankle. ? Make sure your tailbone continues to point downward. 5. Hold this  position for __________ seconds. 6. Slowly return to the starting position. Repeat __________ times. Complete this exercise __________ times a day.   Strengthening exercises These exercises build strength and endurance in your hip. Endurance is the ability to use your muscles for a long time, even after they get tired.    Straight leg raises, side-lying This exercise strengthens the muscles that move the hip joint away from the center of the body (hip abductors). 1. Lie on your side with your left / right leg in the top position. Lie so your head, shoulder, hip, and knee line up. You may bend your bottom knee slightly to help you balance. 2. Roll your hips slightly forward, so your hips are stacked directly over each other and your left / right knee is facing forward. 3. Leading with your heel, lift your top leg 4-6 inches (10-15 cm). You should feel the muscles in your top hip lifting. ? Do not let your foot drift forward. ? Do not let your knee roll toward the ceiling. 4. Hold this position for __________ seconds. 5. Slowly return to the starting position. 6. Let your muscles relax completely between repetitions. Repeat __________ times. Complete this exercise __________ times a day.   Straight leg raises, side-lying This exercise strengthens the muscles that move the hip joint toward the center of the body (hip adductors). 1. Lie on your side with your left / right leg in the bottom position. Lie so your head, shoulder, hip, and knee line up. You may place your upper foot in front to help you balance. 2. Roll your hips slightly forward, so your hips are stacked directly over each other and your left / right knee is facing forward. 3. Tense the muscles in your inner thigh and lift your bottom leg 4-6 inches (10-15 cm). 4. Hold this position for __________ seconds. 5. Slowly return to the starting position. 6. Let your muscles relax completely between repetitions. Repeat __________ times.  Complete this exercise __________ times a day.   Straight leg raises, supine This exercise strengthens the muscles in the front of your thigh (quadriceps). 1. Lie on your back (supine position) with your left / right leg extended and your other knee bent. 2. Tense the muscles in the front of your left / right thigh. You should see your kneecap slide up or see increased dimpling just above your knee. 3. Keep these muscles tight as you raise your leg 4-6 inches (10-15 cm) off the floor. Do not let your knee bend. 4. Hold this position for __________ seconds. 5. Keep these muscles tense as you lower your leg. 6. Relax the muscles slowly and completely between repetitions. Repeat __________ times. Complete this exercise __________ times a day.   Hip abductors, standing This exercise strengthens the muscles that move the leg and hip joint away from the center of the body (hip abductors). 1. Tie one end of a rubber exercise band or tubing to a secure surface, such as a chair, table, or  pole. 2. Loop the other end of the band or tubing around your left / right ankle. 3. Keeping your ankle with the band or tubing directly opposite the secured end, step away until there is tension in the tubing or band. Hold on to a chair, table, or pole as needed for balance. 4. Lift your left / right leg out to your side. While you do this: ? Keep your back upright. ? Keep your shoulders over your hips. ? Keep your toes pointing forward. ? Make sure to use your hip muscles to slowly lift your leg. Do not tip your body or forcefully lift your leg. 5. Hold this position for __________ seconds. 6. Slowly return to the starting position. Repeat __________ times. Complete this exercise __________ times a day. Squats This exercise strengthens the muscles in the front of your thigh (quadriceps). 1. Stand in a door frame so your feet and knees are in line with the frame. You may place your hands on the frame for  balance. 2. Slowly bend your knees and lower your hips like you are going to sit in a chair. ? Keep your lower legs in a straight-up-and-down position. ? Do not let your hips go lower than your knees. ? Do not bend your knees lower than told by your health care provider. ? If your hip pain increases, do not bend as low. 3. Hold this position for ___________ seconds. 4. Slowly push with your legs to return to standing. Do not use your hands to pull yourself to standing. Repeat __________ times. Complete this exercise __________ times a day. This information is not intended to replace advice given to you by your health care provider. Make sure you discuss any questions you have with your health care provider. Document Revised: 06/12/2019 Document Reviewed: 09/17/2018 Elsevier Patient Education  [place].  Back Exercises These exercises help to make your trunk and back strong. They also help to keep the lower back flexible. Doing these exercises can help to prevent back pain or lessen existing pain.  If you have back pain, try to do these exercises 2-3 times each day or as told by your doctor.  As you get better, do the exercises once each day. Repeat the exercises more often as told by your doctor.  To stop back pain from coming back, do the exercises once each day, or as told by your doctor. Exercises Single knee to chest Do these steps 3-5 times in a row for each leg: 1. Lie on your back on a firm bed or the floor with your legs stretched out. 2. Bring one knee to your chest. 3. Grab your knee or thigh with both hands and hold them it in place. 4. Pull on your knee until you feel a gentle stretch in your lower back or buttocks. 5. Keep doing the stretch for 10-30 seconds. 6. Slowly let go of your leg and straighten it. Pelvic tilt Do these steps 5-10 times in a row: 1. Lie on your back on a firm bed or the floor with your legs stretched out. 2. Bend your knees so they point  up to the ceiling. Your feet should be flat on the floor. 3. Tighten your lower belly (abdomen) muscles to press your lower back against the floor. This will make your tailbone point up to the ceiling instead of pointing down to your feet or the floor. 4. Stay in this position for 5-10 seconds while you gently tighten your muscles  and breathe evenly. Cat-cow Do these steps until your lower back bends more easily: 1. Get on your hands and knees on a firm surface. Keep your hands under your shoulders, and keep your knees under your hips. You may put padding under your knees. 2. Let your head hang down toward your chest. Tighten (contract) the muscles in your belly. Point your tailbone toward the floor so your lower back becomes rounded like the back of a cat. 3. Stay in this position for 5 seconds. 4. Slowly lift your head. Let the muscles of your belly relax. Point your tailbone up toward the ceiling so your back forms a sagging arch like the back of a cow. 5. Stay in this position for 5 seconds.   Press-ups Do these steps 5-10 times in a row: 1. Lie on your belly (face-down) on the floor. 2. Place your hands near your head, about shoulder-width apart. 3. While you keep your back relaxed and keep your hips on the floor, slowly straighten your arms to raise the top half of your body and lift your shoulders. Do not use your back muscles. You may change where you place your hands in order to make yourself more comfortable. 4. Stay in this position for 5 seconds. 5. Slowly return to lying flat on the floor.      Belly crunches Do these steps 5-10 times in a row: 1. Lie on your back on a firm bed or the floor with your legs stretched out. 2. Bend your knees so they point up to the ceiling. Your feet should be flat on the floor. 3. Cross your arms over your chest. 4. Tip your chin a little bit toward your chest but do not bend your neck. 5. Tighten your belly muscles and slowly raise your chest just  enough to lift your shoulder blades a tiny bit off of the floor. Avoid raising your body higher than that, because it can put too much stress on your low back. 6. Slowly lower your chest and your head to the floor. Back lifts Do these steps 5-10 times in a row: 1. Lie on your belly (face-down) with your arms at your sides, and rest your forehead on the floor. 2. Tighten the muscles in your legs and your butt. 3. Slowly lift your chest off of the floor while you keep your hips on the floor. Keep the back of your head in line with the curve in your back. Look at the floor while you do this. 4. Stay in this position for 3-5 seconds. 5. Slowly lower your chest and your face to the floor. Contact a doctor if:  Your back pain gets a lot worse when you do an exercise.  Your back pain does not get better 2 hours after you exercise. If you have any of these problems, stop doing the exercises. Do not do them again unless your doctor says it is okay. Get help right away if:  You have sudden, very bad back pain. If this happens, stop doing the exercises. Do not do them again unless your doctor says it is okay. This information is not intended to replace advice given to you by your health care provider. Make sure you discuss any questions you have with your health care provider. Document Revised: 08/01/2018 Document Reviewed: 08/01/2018 Elsevier Patient Education  2021 [employer].

## 2021-03-28 ENCOUNTER — Other Ambulatory Visit: Payer: Self-pay

## 2021-03-28 ENCOUNTER — Ambulatory Visit
Admission: RE | Admit: 2021-03-28 | Discharge: 2021-03-28 | Disposition: A | Discharge status: Home / Self Care | Payer: Medicaid Other | Source: Ambulatory Visit | Attending: Family Medicine | Admitting: Family Medicine

## 2021-03-28 DIAGNOSIS — M8589 Other specified disorders of bone density and structure, multiple sites: Secondary | ICD-10-CM | POA: Diagnosis not present

## 2021-03-28 DIAGNOSIS — M810 Age-related osteoporosis without current pathological fracture: Secondary | ICD-10-CM

## 2021-03-28 DIAGNOSIS — Z780 Asymptomatic menopausal state: Secondary | ICD-10-CM | POA: Diagnosis not present

## 2021-03-30 ENCOUNTER — Encounter: Payer: Self-pay | Admitting: Family Medicine

## 2021-03-30 ENCOUNTER — Ambulatory Visit (INDEPENDENT_AMBULATORY_CARE_PROVIDER_SITE_OTHER): Payer: Medicaid Other | Admitting: Family Medicine

## 2021-03-30 ENCOUNTER — Other Ambulatory Visit: Payer: Self-pay

## 2021-03-30 VITALS — BP 132/80 | HR 77 | Temp 98.4°F | Ht 63.0 in | Wt 144.2 lb

## 2021-03-30 DIAGNOSIS — M810 Age-related osteoporosis without current pathological fracture: Secondary | ICD-10-CM

## 2021-03-30 DIAGNOSIS — C20 Malignant neoplasm of rectum: Secondary | ICD-10-CM | POA: Diagnosis not present

## 2021-03-30 DIAGNOSIS — Z13220 Encounter for screening for lipoid disorders: Secondary | ICD-10-CM

## 2021-03-30 DIAGNOSIS — Z0000 Encounter for general adult medical examination without abnormal findings: Secondary | ICD-10-CM

## 2021-03-30 DIAGNOSIS — Z5320 Procedure and treatment not carried out because of patient's decision for unspecified reasons: Secondary | ICD-10-CM

## 2021-03-30 DIAGNOSIS — Z1321 Encounter for screening for nutritional disorder: Secondary | ICD-10-CM | POA: Diagnosis not present

## 2021-03-30 NOTE — Progress Notes (Signed)
[name] is a 75 y.o. female  Chief Complaint  Patient presents with  . Annual Exam    Pt not fasting today. Pt not UTD on mammo,  pt has had a recent  Tdap.    HPI: [name] is a 75 y.o. female patient seen today for annual CPE. She is not fasting for labs, she at 6hrs ago.   Last mammo: declines Last Dexa: 03/2021 - T-score = -2.8 (T-score = - 3.9 in 04/2018). Pt is taking calcium 1200mg  daily, Vit D 5000IU daily Last colonoscopy: 10/2020 - due in 10/2021 d/t h/o rectal cancer  Dental: UTD Vision: wears glasses and UTD on exam  Med refills needed today? n/a   Past Medical History:  Diagnosis Date  . Anxiety   . Arthritis   . Cataract   . Colon cancer ([place])   . History of kidney stones     in system  . Kidney stones   . Macular degeneration   . Neuromuscular disorder ([place])   . Osteoporosis   . Rectal cancer ([place])   . 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 ABDOMINAL HYSTERECTOMY    . COLONOSCOPY  10/07/2019  . EYE SURGERY Bilateral    cataract  . SPINE SURGERY  2017   schwannoma of spinal cord  . TONSILLECTOMY    . TOOTH EXTRACTION N/A 11/26/2020   Procedure: DENTAL RESTORATION/EXTRACTIONS;  Surgeon: Diona Browner, DMD;  Location: [facility] OR;  Service: Oral Surgery;  Laterality: N/A;    Social History   Socioeconomic History  . Marital status: Divorced    Spouse name: Not on file  . Number of children: 1  . Years of education: Not on file  . Highest education level: Not on file  Occupational History  . Not on file  Tobacco Use  . Smoking status: Former Smoker    Packs/day: 2.00    Years: 26.00    Pack years: 52.00    Quit date: 11/21/1987    Years since quitting: 33.3  . Smokeless tobacco: Never Used  Vaping Use  . Vaping Use: Never used  Substance and Sexual Activity  . Alcohol use: Yes    Alcohol/week: 1.0 standard drink    Types: 1 Glasses of wine per week    Comment:  wine socially  . Drug use: Yes    Types: Other-see comments    Comment: CBD  . Sexual activity: Yes  Other Topics Concern  . Not on file  Social History Narrative  . Not on file   Social Determinants of Health   Financial Resource Strain: Not on file  Food Insecurity: Not on file  Transportation Needs: Not on file  Physical Activity: Not on file  Stress: Not on file  Social Connections: Not on file  Intimate Partner Violence: Not on file    Family History  Problem Relation Age of Onset  . Cancer Mother        head and neck cancer   . Cancer Sister        lung cancer   . Cancer Brother        pancreatic cancer   . Pancreatic cancer Brother   . Cancer Paternal Grandmother        breast cancer   . Colon cancer Neg Hx   . Esophageal cancer Neg Hx   . Rectal cancer Neg Hx   . Stomach cancer Neg Hx   .  Inflammatory bowel disease Neg Hx   . Liver disease Neg Hx       There is no immunization history on file for this patient.  Outpatient Encounter Medications as of 03/30/2021  Medication Sig  . acetaminophen (TYLENOL) 500 MG tablet Take 500 mg by mouth every 6 (six) hours as needed for moderate pain or mild pain.  . calcium carbonate (OS-CAL) 600 MG TABS tablet Take 600 mg by mouth daily with breakfast.  . Cholecalciferol (VITAMIN D-3) 125 MCG (5000 UT) TABS Take 5,000 Units by mouth daily.  . polycarbophil (FIBERCON) 625 MG tablet Take 625 mg by mouth daily.  Marland Kitchen amoxicillin (AMOXIL) 500 MG capsule Take 1 capsule (500 mg total) by mouth 3 (three) times daily. (Patient not taking: Reported on 02/18/2021)  . meloxicam (MOBIC) 7.5 MG tablet Take 1 tablet (7.5 mg total) by mouth daily. With food (Patient not taking: Reported on 03/30/2021)  . oxyCODONE-acetaminophen (PERCOCET) 5-325 MG tablet Take 1 tablet by mouth every 4 (four) hours as needed. (Patient not taking: No sig reported)  . valACYclovir (VALTREX) 1000 MG tablet Take 1,000 mg by mouth daily as needed (stress/ rash).  (Patient not taking: No sig reported)   No facility-administered encounter medications on file as of 03/30/2021.     ROS: Gen: no fever, chills  Skin: no rash, itching ENT: no ear pain, ear drainage, nasal congestion, rhinorrhea, sinus pressure, sore throat Resp: no cough, wheeze,SOB Breast: no breast tenderness, no nipple discharge, no breast masses CV: no CP, palpitations, LE edema,  GI: no heartburn, n/v/d/c, abd pain GU: no dysuria, urgency, frequency, hematuria MSK: no joint pain, myalgias, + back pain Neuro: no dizziness, headache, weakness Psych: no depression, anxiety, insomnia   Allergies  Allergen Reactions  . Morphine And Related Anaphylaxis  . Other Other (See Comments)    Stitches reject    BP 132/80 (BP Location: Left Arm, Patient Position: Sitting, Cuff Size: Normal)   Pulse 77   Temp 98.4 F (36.9 C) (Temporal)   Ht 5\' 3"  (1.6 m)   Wt 144 lb 3.2 oz (65.4 kg)   SpO2 99%   BMI 25.54 kg/m    Wt Readings from Last 3 Encounters:  03/30/21 144 lb 3.2 oz (65.4 kg)  02/18/21 144 lb 3.2 oz (65.4 kg)  02/07/21 146 lb 4.8 oz (66.4 kg)   Temp Readings from Last 3 Encounters:  03/30/21 98.4 F (36.9 C) (Temporal)  02/18/21 (!) 97 F (36.1 C) (Temporal)  02/07/21 98.3 F (36.8 C) (Tympanic)   BP Readings from Last 3 Encounters:  03/30/21 132/80  02/18/21 120/70  02/07/21 (!) 145/70   Pulse Readings from Last 3 Encounters:  03/30/21 77  02/18/21 74  02/07/21 78     Physical Exam Constitutional:      General: She is not in acute distress.    Appearance: She is well-developed.  HENT:     Head: Normocephalic and atraumatic.     Right Ear: Tympanic membrane and ear canal normal.     Left Ear: Tympanic membrane and ear canal normal.     Nose: Nose normal.     Mouth/Throat:     Mouth: Mucous membranes are moist.     Pharynx: Oropharynx is clear.  Eyes:     Conjunctiva/sclera: Conjunctivae normal.  Neck:     Thyroid: No thyromegaly.   Cardiovascular:     Rate and Rhythm: Normal rate and regular rhythm.     Pulses: Normal pulses.  Pulmonary:  Effort: Pulmonary effort is normal. No respiratory distress.     Breath sounds: Normal breath sounds. No wheezing or rhonchi.  Abdominal:     General: Bowel sounds are normal. There is no distension.     Palpations: Abdomen is soft. There is no mass.     Tenderness: There is no abdominal tenderness.  Musculoskeletal:     Cervical back: Neck supple.     Right lower leg: No edema.     Left lower leg: No edema.  Lymphadenopathy:     Cervical: No cervical adenopathy.  Skin:    General: Skin is warm and dry.  Neurological:     Mental Status: She is alert and oriented to person, place, and time.     Motor: No abnormal muscle tone.     Coordination: Coordination normal.  Psychiatric:        Behavior: Behavior normal.    A/P:  1. Annual physical exam - discussed importance of regular CV exercise, healthy diet, adequate sleep - declines mammo, dexa and colonoscopy UTD - dental and vision exams UTD - ALT; Future - AST; Future - Basic metabolic panel; Future - CBC; Future - next CPE in 1 year  2. Age-related osteoporosis without current pathological fracture - dexa improved in 03/2021 compared to 04/2018 - cont calcium 1200mg  and Vit D 5000IU daily - VITAMIN D 25 Hydroxy (Vit-D Deficiency, Fractures); Future  3. Rectal cancer ([place]) - last colonoscopy in 10/2020 and due for repeat in 10/2021  4. Mammogram declined  5. Screening for lipid disorders - Lipid panel; Future  6. Encounter for vitamin deficiency screening - takes Vit D 5000IU daily - VITAMIN D 25 Hydroxy (Vit-D Deficiency, Fractures); Future   This visit occurred during the SARS-CoV-2 public health emergency.  Safety protocols were in place, including screening questions prior to the visit, additional usage of staff PPE, and extensive cleaning of exam room while observing appropriate contact time as  indicated for disinfecting solutions.

## 2021-04-06 ENCOUNTER — Inpatient Hospital Stay: Payer: Medicaid Other | Attending: Hematology

## 2021-04-06 ENCOUNTER — Other Ambulatory Visit: Payer: Self-pay

## 2021-04-06 ENCOUNTER — Telehealth: Payer: Self-pay

## 2021-04-06 DIAGNOSIS — C20 Malignant neoplasm of rectum: Secondary | ICD-10-CM | POA: Insufficient documentation

## 2021-04-06 DIAGNOSIS — Z452 Encounter for adjustment and management of vascular access device: Secondary | ICD-10-CM | POA: Diagnosis not present

## 2021-04-06 DIAGNOSIS — Z95828 Presence of other vascular implants and grafts: Secondary | ICD-10-CM

## 2021-04-06 DIAGNOSIS — M810 Age-related osteoporosis without current pathological fracture: Secondary | ICD-10-CM

## 2021-04-06 MED ORDER — SODIUM CHLORIDE 0.9% FLUSH
10.0000 mL | Freq: Once | INTRAVENOUS | Status: AC
  Administered 2021-04-06: 10 mL
  Filled 2021-04-06: qty 10

## 2021-04-06 MED ORDER — HEPARIN SOD (PORK) LOCK FLUSH 100 UNIT/ML IV SOLN
500.0000 [IU] | Freq: Once | INTRAVENOUS | Status: AC
  Administered 2021-04-06: 500 [IU]
  Filled 2021-04-06: qty 5

## 2021-04-06 NOTE — Telephone Encounter (Signed)
Called pt reviewed bone density pt states PCP had reviewed results with her and she was aware encouraged to call for any questions concerns or changes

## 2021-04-06 NOTE — Telephone Encounter (Signed)
-----   Message from Truitt Merle, MD sent at 04/03/2021  6:51 PM EDT ----- Please let pt know her DEXA scan result, she has osteoprosis. Prolia has been held due to her dental work. Thanks   Truitt Merle  04/03/2021

## 2021-04-07 ENCOUNTER — Other Ambulatory Visit (INDEPENDENT_AMBULATORY_CARE_PROVIDER_SITE_OTHER): Payer: Medicaid Other

## 2021-04-07 ENCOUNTER — Encounter: Payer: Self-pay | Admitting: Family Medicine

## 2021-04-07 DIAGNOSIS — Z13220 Encounter for screening for lipoid disorders: Secondary | ICD-10-CM | POA: Diagnosis not present

## 2021-04-07 DIAGNOSIS — M810 Age-related osteoporosis without current pathological fracture: Secondary | ICD-10-CM | POA: Diagnosis not present

## 2021-04-07 DIAGNOSIS — E7800 Pure hypercholesterolemia, unspecified: Secondary | ICD-10-CM

## 2021-04-07 DIAGNOSIS — Z1321 Encounter for screening for nutritional disorder: Secondary | ICD-10-CM | POA: Diagnosis not present

## 2021-04-07 DIAGNOSIS — E559 Vitamin D deficiency, unspecified: Secondary | ICD-10-CM

## 2021-04-07 DIAGNOSIS — Z0000 Encounter for general adult medical examination without abnormal findings: Secondary | ICD-10-CM | POA: Diagnosis not present

## 2021-04-07 LAB — BASIC METABOLIC PANEL
BUN: 11 mg/dL (ref 6–23)
CO2: 28 mEq/L (ref 19–32)
Calcium: 9.8 mg/dL (ref 8.4–10.5)
Chloride: 105 mEq/L (ref 96–112)
Creatinine, Ser: 0.67 mg/dL (ref 0.40–1.20)
GFR: 86.07 mL/min (ref >60.00)
Glucose, Bld: 92 mg/dL (ref 70–99)
Potassium: 4.8 mEq/L (ref 3.5–5.1)
Sodium: 139 mEq/L (ref 135–145)

## 2021-04-07 LAB — CBC
HCT: 36.8 % (ref 36.0–46.0)
Hemoglobin: 12.6 g/dL (ref 12.0–15.0)
MCHC: 34.1 g/dL (ref 30.0–36.0)
MCV: 86.3 fl (ref 78.0–100.0)
Platelets: 249 10*3/uL (ref 150.0–400.0)
RBC: 4.27 Mil/uL (ref 3.87–5.11)
RDW: 13.9 % (ref 11.5–15.5)
WBC: 3.9 10*3/uL — ABNORMAL LOW (ref 4.0–10.5)

## 2021-04-07 LAB — AST: AST: 12 U/L (ref 0–37)

## 2021-04-07 LAB — ALT: ALT: 8 U/L (ref 0–35)

## 2021-04-07 LAB — LIPID PANEL
Cholesterol: 253 mg/dL — ABNORMAL HIGH (ref 0–200)
HDL: 73.8 mg/dL (ref >39.00)
LDL Cholesterol: 161 mg/dL — ABNORMAL HIGH (ref 0–99)
NonHDL: 178.92
Total CHOL/HDL Ratio: 3
Triglycerides: 90 mg/dL (ref 0.0–149.0)
VLDL: 18 mg/dL (ref 0.0–40.0)

## 2021-04-07 LAB — VITAMIN D 25 HYDROXY (VIT D DEFICIENCY, FRACTURES): VITD: 25.42 ng/mL — ABNORMAL LOW (ref 30.00–100.00)

## 2021-04-13 ENCOUNTER — Telehealth: Payer: Self-pay

## 2021-04-13 DIAGNOSIS — E559 Vitamin D deficiency, unspecified: Secondary | ICD-10-CM

## 2021-04-13 MED ORDER — ATORVASTATIN CALCIUM 20 MG PO TABS: 20.0000 mg | ORAL_TABLET | Freq: Every day | ORAL | 3 refills | Status: DC

## 2021-04-14 MED ORDER — VITAMIN D (ERGOCALCIFEROL) 1.25 MG (50000 UNIT) PO CAPS
50000.0000 [IU] | ORAL_CAPSULE | ORAL | 0 refills | Status: DC
Start: 2021-04-14 — End: 2021-06-20

## 2021-04-14 MED ORDER — VITAMIN D (ERGOCALCIFEROL) 1.25 MG (50000 UNIT) PO CAPS: 50000.0000 [IU] | ORAL_CAPSULE | ORAL | 3 refills | Status: DC

## 2021-04-14 NOTE — Telephone Encounter (Signed)
Patient is calling to get a refill on her Cholecalciferol (VITAMIN D-3) 125 MCG (5000 UT) TABS. If approved, please send in and call patient at [phone] to let her know that it has been sent in.

## 2021-04-14 NOTE — Telephone Encounter (Signed)
Rx sent in today. 

## 2021-04-18 IMAGING — CT CT CHEST W/ CM
2 of 5 series · 12 of 36 positions shown, 15 images · IV contrast (omnipaque)
Comparison: CT chest abdomen pelvis September 08, 2019.

CLINICAL DATA: History of colorectal cancer initial diagnosis 1872,
chemotherapy and radiation therapy complete. Restaging/surveillance.

EXAM:
CT CHEST, ABDOMEN, AND PELVIS WITH CONTRAST
TECHNIQUE: Multidetector CT imaging of the chest, abdomen and pelvis was
performed following the standard protocol during bolus
administration of intravenous contrast.
CONTRAST:  100mL OMNIPAQUE IOHEXOL 300 MG/ML  SOLN

[Series 2: cap with · axial · 0.82mm/px · z∈[-568,-48]mm · 9 of 132 slices shown, 12 images]
[im 14/132  mediastinal]
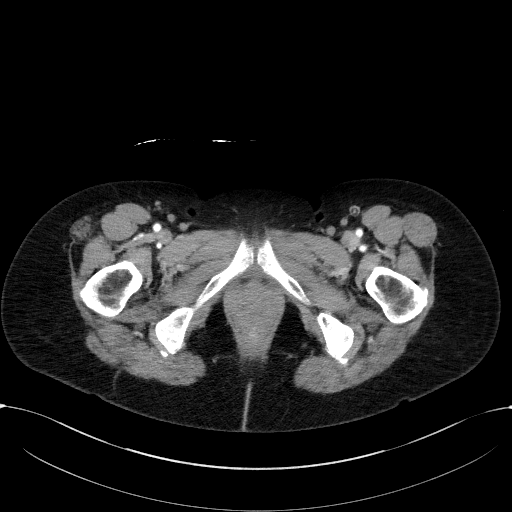
[im 14/132  lung]
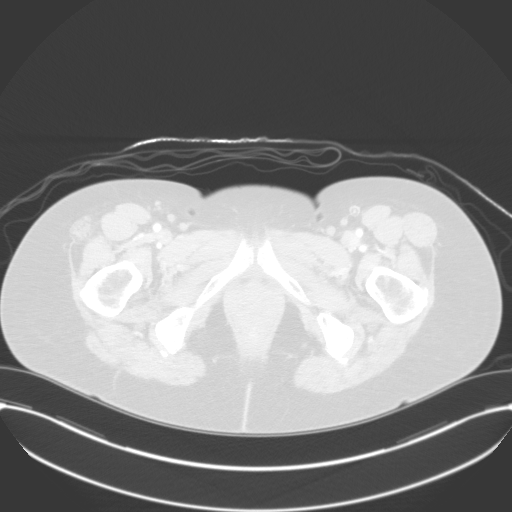
[im 27/132  lung]
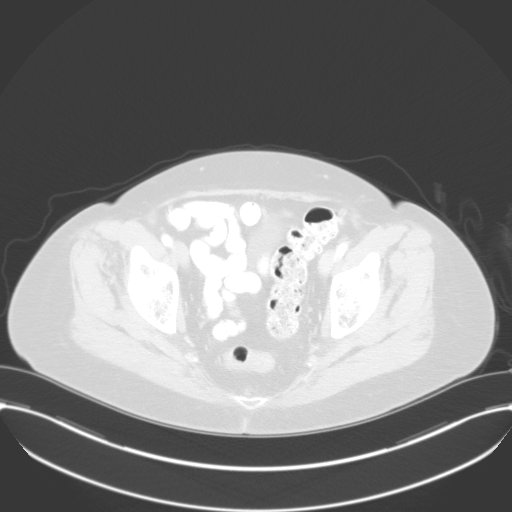
[im 40/132  lung]
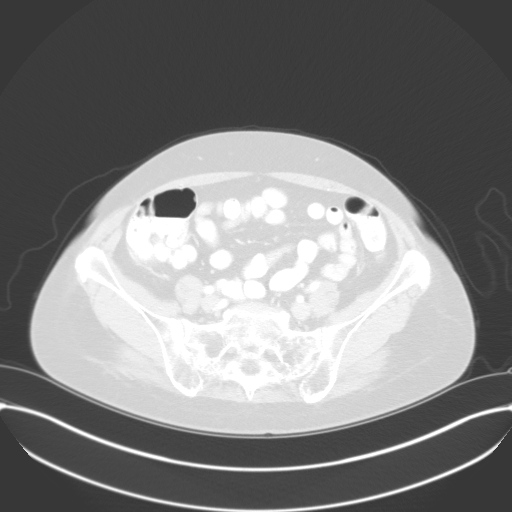
[im 53/132  lung]
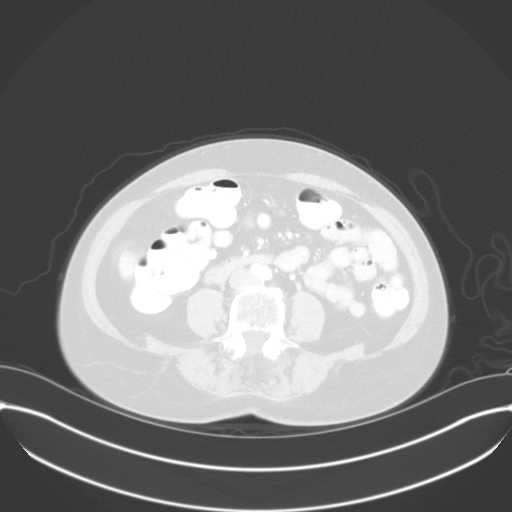
[im 66/132  mediastinal]
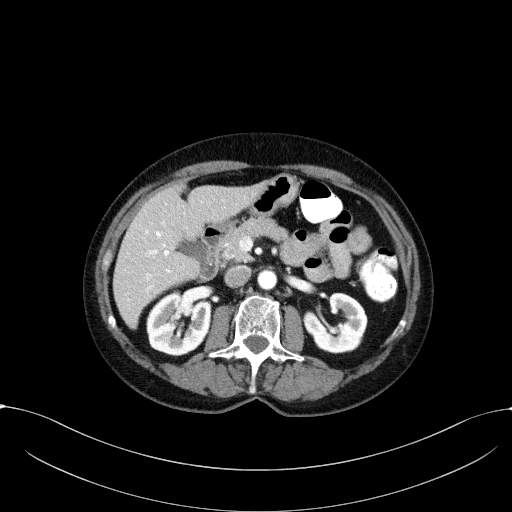
[im 66/132  lung]
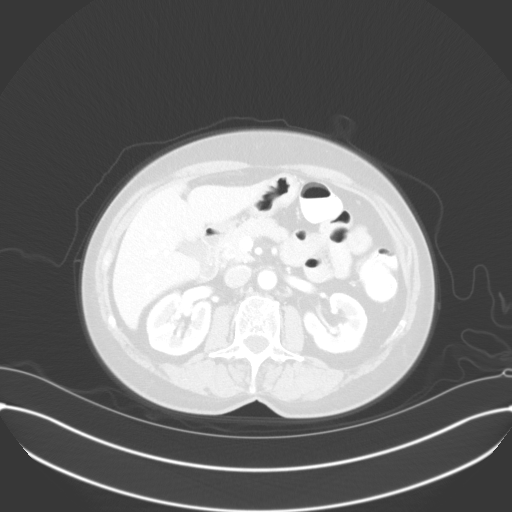
[im 79/132  lung]
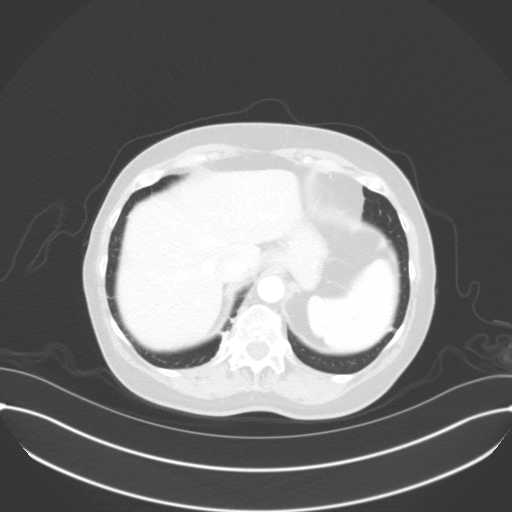
[im 92/132  lung]
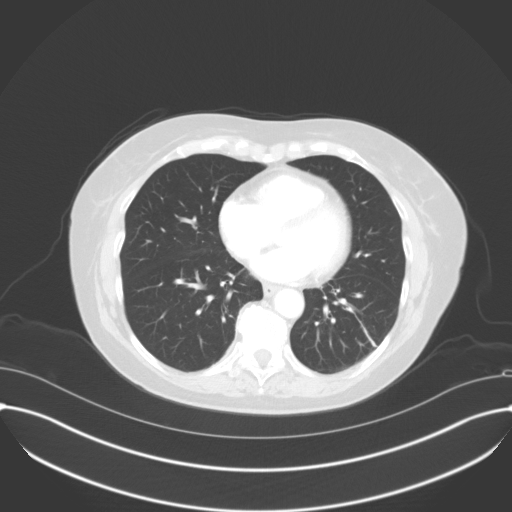
[im 105/132  lung]
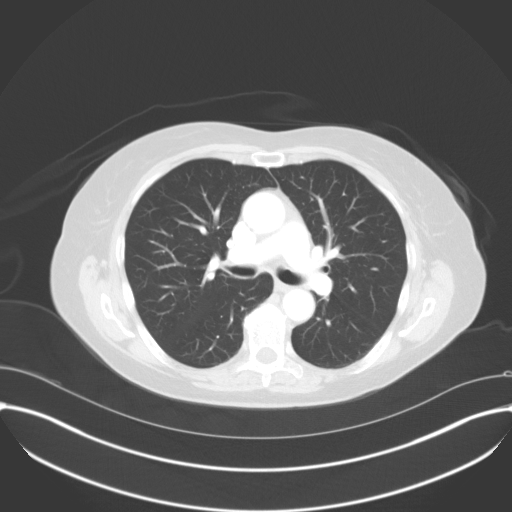
[im 118/132  mediastinal]
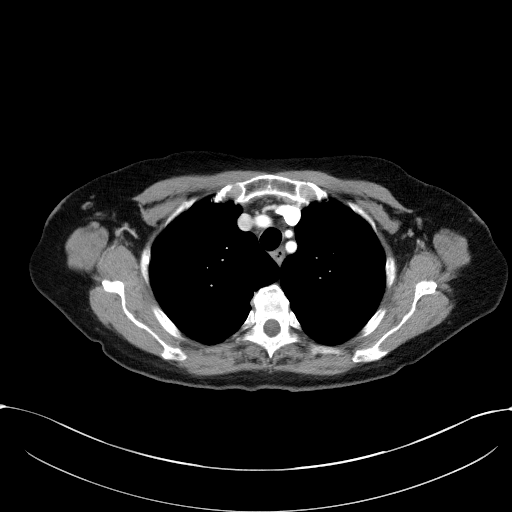
[im 118/132  lung]
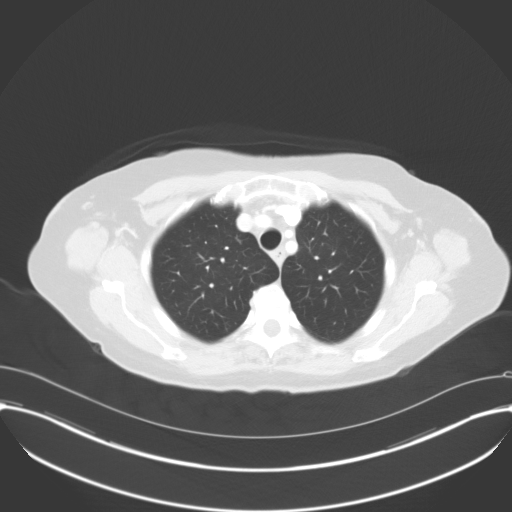

[Series 6: coronals · coronal · 0.68mm/px · 3 of 139 slices shown]
[im 28/139  lung]
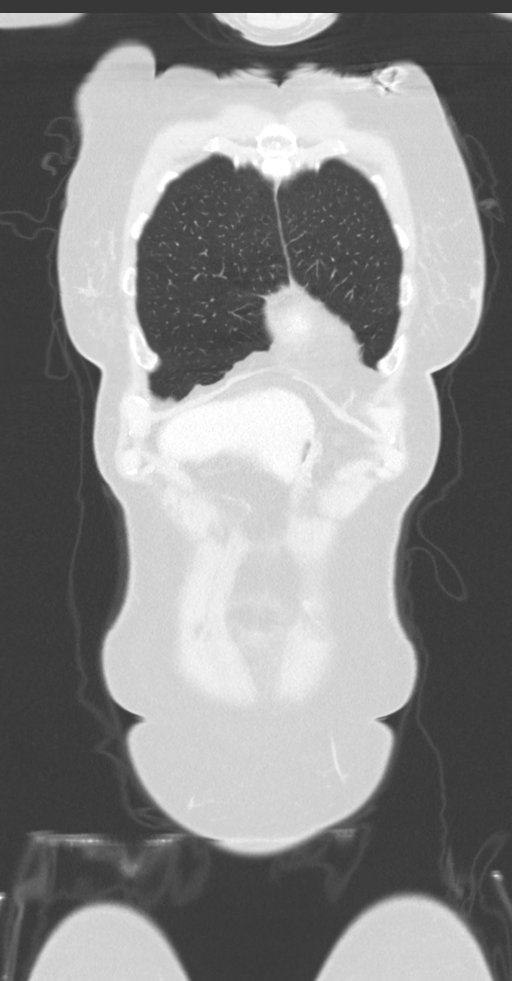
[im 56/139  lung]
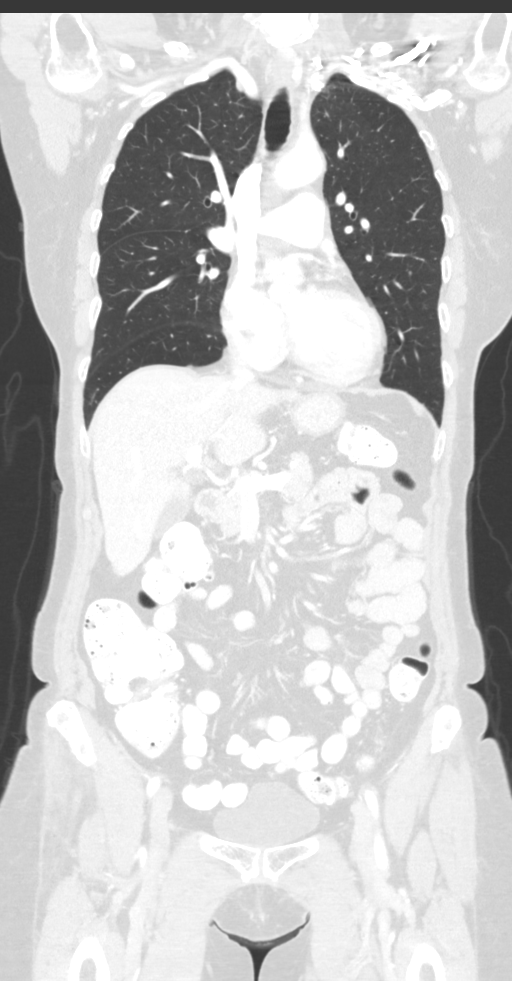
[im 83/139  lung]
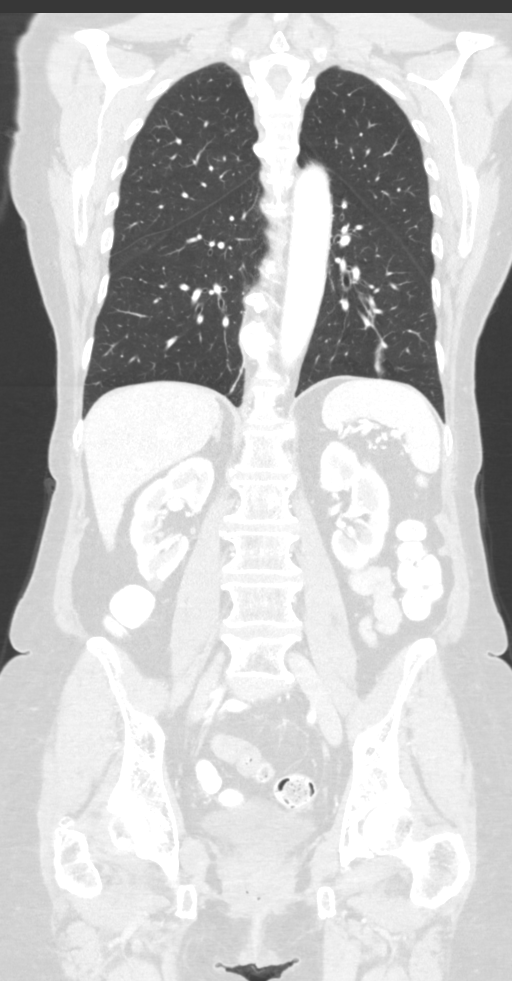

[12 of 36 positions shown; findings below may reference images not displayed]

FINDINGS: CT CHEST FINDINGS

Cardiovascular: No thoracic aortic aneurysm. No central pulmonary
embolus. Punctate focus of gas in the right ventricular outflow
tract, likely secondary to venous access. Normal size heart. No
pericardial effusion.

Mediastinum/Nodes: Small bilateral thyroid nodules measuring up to 8
mm. Not clinically significant; no follow-up imaging recommended
(ref: [HOSPITAL]. [DATE]): 143-50).No mediastinal,
hilar or axillary lymphadenopathy. Trachea and esophagus are grossly
unremarkable.

Lungs/Pleura: Linear scarring versus atelectasis in the bilateral
lower lobes. No suspicious pulmonary nodules or masses. No pleural
effusion. No pneumothorax.

Musculoskeletal: Multilevel degenerative change of the thoracic
spine. No suspicious lytic or blastic lesion of bone.

CT ABDOMEN PELVIS FINDINGS

Hepatobiliary: Hepatic steatosis. No suspicious hepatic lesions.
Tiny layering gallstones measuring up to 8 mm. No gallbladder
dilation, wall thickening or pericholecystic fluid. No biliary
ductal dilation.

Pancreas: No pancreatic ductal dilation. No evidence of acute
inflammation.

Spleen: Normal in size without focal abnormality.

Adrenals/Urinary Tract: Bilateral adrenal glands are unremarkable.

1.5 cm right interpolar renal cyst and exophytic 7 mm right lower
pole renal cyst. Bilateral tiny hypodense renal lesions which are
technically too small to accurately characterize but favored
represent cysts. Symmetric enhancement and excretion of contrast on
delayed imaging. No suspicious filling defect visualized within the
opacified portions of the collecting system and ureter on delayed
imaging.

Urinary bladder is grossly unremarkable for degree of distension

Stomach/Bowel: Stomach is predominantly decompressed limiting
evaluation but grossly unremarkable. Normal positioning of the
duodenum/ligament of Treitz. No suspicious small bowel wall
thickening or dilation. The appendix and terminal ileum are
unremarkable. Enteric contrast visualized to the level of the
sigmoid colon. Decreased mild rectal wall thickening without overt
mass like lesion.

Vascular/Lymphatic: Aortic atherosclerosis. No enlarged abdominal or
pelvic lymph nodes.

Reproductive: Status post hysterectomy. No adnexal masses.

Other: No abdominopelvic ascites. No discrete mesenteric or omental
nodularity.

Musculoskeletal: Multilevel degenerative changes spine. Post
radiation change in the lumbosacral region. Unchanged appearance of
the lytic right L5 superior endplate lesion on axial image 87/6,
with discogenic disease at the level likely representing a Schmorl's
node. No aggressive lytic or blastic lesions of bone.
IMPRESSION: 1. Post treatment change in the rectal wall without overt mass like
lesion.
2. No evidence of metastatic disease within the chest abdomen or
pelvis.
3. Hepatic steatosis.
4. Cholelithiasis.
5. Aortic atherosclerosis.

Aortic Atherosclerosis ([email or web address]).

## 2021-05-06 NOTE — Telephone Encounter (Signed)
[facility] called and said that they had talked to pt and she said that she would like to get a replacement for vitamin d3 because it has aspartame and she is worried because of her history. Please advise

## 2021-05-09 NOTE — Telephone Encounter (Signed)
After I spoke with Dr. Bryan Lemma in regards to pt's message to [facility], I called pt to inform her what Dr. Vivia Ewing feedback.  Dr. Loletha Grayer said that pt can stop the Rx for Vit D and start an OTC Vitamin D, because there is not another type of rx'd Vit D for her to send in for pt.  Pt informed me that she is taking OTC vit D/BID at 1000IU's a day. I told pt that I would let Dr. Loletha Grayer know what she has decided to do. Pt also wanted to get opinion from Dr. Loletha Grayer about her knee pain, pt explains that she received an injection and seems to work but in pm she has pain.  Pt would like to get Dr. Lurline Del feedback on what she thinks pt should do.  Please advise.  Pt does know that Dr. Loletha Grayer is out of the office this week.

## 2021-05-19 NOTE — Telephone Encounter (Signed)
Pt informed of message from Dr. Loletha Grayer,.

## 2021-05-19 NOTE — Telephone Encounter (Signed)
For as needed pain, she can take extra strength tylenol or ibuprofen 600-800mg  every 6-8hrs w/ food as needed

## 2021-05-19 NOTE — Telephone Encounter (Signed)
Left message on voicemail to call office.  

## 2021-05-30 ENCOUNTER — Telehealth: Payer: Self-pay

## 2021-05-30 NOTE — Telephone Encounter (Signed)
This nurse spoke with this patient who called and stated that she would like an office visit because she has been having some issues bowel and rectal issues.  She has been having soft or loose stools 5-6 times a day, she has had some pain to her lower left abdomen that is coming and going.  Stated this started after eating [place] last week and the Guacamole was spicy and she does not usually eat spicy foods.  But the symptoms have not gotten better.  She has an 1130a port flush on 7/20 and would like to know if she could also see the doctor that day.  This nurse informed patient that this information will be forwarded to the provider.

## 2021-05-31 ENCOUNTER — Telehealth: Payer: Self-pay

## 2021-05-31 NOTE — Telephone Encounter (Signed)
This nurse contacted patient related to her request for an office visit.  Advised patient that Cira Rue, NP would like to see her on 7/19 and she can have her port flush and lab done on that day as well.  Patient is in agreement with coming on 7/19 at 9 am for port flush and labs and office visit at 930 am on the same day.  No further questions or concerns at this time.

## 2021-06-07 ENCOUNTER — Inpatient Hospital Stay: Payer: Medicaid Other | Admitting: Nurse Practitioner

## 2021-06-07 ENCOUNTER — Other Ambulatory Visit: Payer: Self-pay

## 2021-06-07 ENCOUNTER — Inpatient Hospital Stay: Payer: Medicaid Other | Attending: Nurse Practitioner

## 2021-06-07 ENCOUNTER — Encounter: Payer: Self-pay | Admitting: Nurse Practitioner

## 2021-06-07 VITALS — BP 128/65 | HR 65 | Temp 97.6°F | Resp 17 | Wt 146.6 lb

## 2021-06-07 DIAGNOSIS — M810 Age-related osteoporosis without current pathological fracture: Secondary | ICD-10-CM

## 2021-06-07 DIAGNOSIS — Z452 Encounter for adjustment and management of vascular access device: Secondary | ICD-10-CM | POA: Diagnosis not present

## 2021-06-07 DIAGNOSIS — Z95828 Presence of other vascular implants and grafts: Secondary | ICD-10-CM

## 2021-06-07 DIAGNOSIS — M545 Low back pain, unspecified: Secondary | ICD-10-CM | POA: Insufficient documentation

## 2021-06-07 DIAGNOSIS — G8929 Other chronic pain: Secondary | ICD-10-CM | POA: Insufficient documentation

## 2021-06-07 DIAGNOSIS — C20 Malignant neoplasm of rectum: Secondary | ICD-10-CM | POA: Diagnosis not present

## 2021-06-07 DIAGNOSIS — R194 Change in bowel habit: Secondary | ICD-10-CM | POA: Diagnosis not present

## 2021-06-07 LAB — CBC WITH DIFFERENTIAL (CANCER CENTER ONLY)
Abs Immature Granulocytes: 0.01 10*3/uL (ref 0.00–0.07)
Basophils Absolute: 0 10*3/uL (ref 0.0–0.1)
Basophils Relative: 1 %
Eosinophils Absolute: 0.1 10*3/uL (ref 0.0–0.5)
Eosinophils Relative: 3 %
HCT: 37.1 % (ref 36.0–46.0)
Hemoglobin: 12.4 g/dL (ref 12.0–15.0)
Immature Granulocytes: 0 %
Lymphocytes Relative: 26 %
Lymphs Abs: 1.2 10*3/uL (ref 0.7–4.0)
MCH: 28.9 pg (ref 26.0–34.0)
MCHC: 33.4 g/dL (ref 30.0–36.0)
MCV: 86.5 fL (ref 80.0–100.0)
Monocytes Absolute: 0.4 10*3/uL (ref 0.1–1.0)
Monocytes Relative: 9 %
Neutro Abs: 2.8 10*3/uL (ref 1.7–7.7)
Neutrophils Relative %: 61 %
Platelet Count: 238 10*3/uL (ref 150–400)
RBC: 4.29 MIL/uL (ref 3.87–5.11)
RDW: 13.5 % (ref 11.5–15.5)
WBC Count: 4.5 10*3/uL (ref 4.0–10.5)
nRBC: 0 % (ref 0.0–0.2)

## 2021-06-07 LAB — CMP (CANCER CENTER ONLY)
ALT: 15 U/L (ref 0–44)
AST: 17 U/L (ref 15–41)
Albumin: 4.1 g/dL (ref 3.5–5.0)
Alkaline Phosphatase: 37 U/L — ABNORMAL LOW (ref 38–126)
Anion gap: 7 (ref 5–15)
BUN: 12 mg/dL (ref 8–23)
CO2: 29 mmol/L (ref 22–32)
Calcium: 10.2 mg/dL (ref 8.9–10.3)
Chloride: 106 mmol/L (ref 98–111)
Creatinine: 0.73 mg/dL (ref 0.44–1.00)
GFR, Estimated: >60 mL/min
Glucose, Bld: 110 mg/dL — ABNORMAL HIGH (ref 70–99)
Potassium: 4 mmol/L (ref 3.5–5.1)
Sodium: 142 mmol/L (ref 135–145)
Total Bilirubin: 0.7 mg/dL (ref 0.3–1.2)
Total Protein: 7.1 g/dL (ref 6.5–8.1)

## 2021-06-07 LAB — CEA (IN HOUSE-CHCC): CEA (CHCC-In House): <1 ng/mL (ref 0.00–5.00)

## 2021-06-07 MED ORDER — ALTEPLASE 2 MG IJ SOLR
2.0000 mg | Freq: Once | INTRAMUSCULAR | Status: AC
  Administered 2021-06-07: 2 mg
  Filled 2021-06-07: qty 2

## 2021-06-07 MED ORDER — HEPARIN SOD (PORK) LOCK FLUSH 100 UNIT/ML IV SOLN
500.0000 [IU] | Freq: Once | INTRAVENOUS | Status: AC
  Administered 2021-06-07: 500 [IU]
  Filled 2021-06-07: qty 5

## 2021-06-07 MED ORDER — SODIUM CHLORIDE 0.9% FLUSH
10.0000 mL | Freq: Once | INTRAVENOUS | Status: AC
  Administered 2021-06-07: 10 mL
  Filled 2021-06-07: qty 10

## 2021-06-07 MED ORDER — ALTEPLASE 2 MG IJ SOLR
INTRAMUSCULAR | Status: AC
  Filled 2021-06-07: qty 2

## 2021-06-07 NOTE — Progress Notes (Signed)
CATHFLO given by Artemio Aly, RN  at [phone] and blood drawn peripherally by Murlean Hark LPN

## 2021-06-07 NOTE — Progress Notes (Signed)
[place] [facility]   [phone] Fax:([phone]) [phone]   Clinic Follow up Note   Patient Care Team: Ronnald Nian, DO as PCP - General (Family Medicine) Truitt Merle, MD as PCP - Hematology/Oncology (Hematology) Mansouraty, Telford Nab., MD as Consulting Physician (Gastroenterology) 06/07/2021  CHIEF COMPLAINT: change in bowel habits   SUMMARY OF ONCOLOGIC HISTORY: Oncology History Overview Note  Cancer Staging Rectal cancer [facility]) Staging form: Colon and Rectum, AJCC 8th Edition - Clinical stage from 05/11/2017: cT3, cN2, cM0 - Signed by Truitt Merle, MD on 08/25/2019     Rectal cancer ([place])  05/11/2017 Cancer Staging   Staging form: Colon and Rectum, AJCC 8th Edition - Clinical stage from 05/11/2017: cT3, cN2, cM0 - Signed by Truitt Merle, MD on 08/25/2019    05/11/2017 Procedure   Colonoscopy by Dr Crissie Reese at [facility] -Palpable rectal mass found on digital rectal exam  -Likely malignant tumor in the rectum. Biopsied.  -One 6 mm polyp in the sigmoid colon, removed with a cold snare, resected and retrieved.  -Diverticulosis at the hepatic flexure and in the ascending colon -the examined portion of the ileum was normal.    09/10/2017 Initial Biopsy   Final Diagnosis  Sigmoid Polyp biopsies:  Tubular Adenoma  2. Rectal Mass biopsies:  Moderately differentiated adenocarcinoma, invasive.  The depth of the invasion cannot be assessed in this biopsy specimen.     - 06/29/2017 Chemotherapy   She received neoadjuvant infusional 5FU with concurrent radiation.     09/17/2017 Procedure   Sigmoid[facility] by Dr. Tami Ribas at [facility] on 09/17/17  was noted to have complete response from chemoRT   10/31/2017 - 01/2018 Chemotherapy   Consolidation Chemo 5FU and Leucovorin. Oxaliplatin was deferred due to pre-existing neuropathy. Pt declined rectal surgery.    01/13/2019 Imaging   CT CAP W Contrast at [place] show persistent and  stable 77mm nodule in the right middle lobe and stable 2mm nodule in the superior segment of the left lower lobe. No new or suspect nodules   Cholelithiasis without evidence of cholecystitis.   Mild compression of the superior plate of L5, Stable   Overall no evidence of disease recurrence in the chest, abdomen or pelvis.    08/25/2019 Initial Diagnosis   Rectal cancer ([place])    09/08/2019 Imaging   CT CAP W contrast 09/08/19  IMPRESSION: 1. Areas of mild rectal wall thickening, likely radiation change. No discrete rectal mass is identified. No perirectal or pelvic or retroperitoneal lymphadenopathy. 2. No findings suspicious for metastatic disease involving the liver or lungs. 3. Cholelithiasis.   10/07/2019 Procedure   Colonoscopy by Dr. Silvestre Moment 10/07/19 IMPRESSION - Hemorrhoids found on digital rectal exam. - The examined portion of the ileum was normal. - One 4 mm polyp in the ascending colon, removed with a cold snare. Resected and retrieved. - A single colonic angioectasia. - Scar in the distal rectum. - Diverticulosis in the recto-sigmoid colon, in the sigmoid colon, in the descending colon and at the hepatic flexure. - Normal mucosa in the entire examined colon otherwise. - Non-bleeding non-thrombosed external and internal hemorrhoids. Diagnosis Surgical [P], colon, ascending, polyp - BENIGN COLONIC MUCOSA WITH LYMPHOID AGGREGATE. - NO DYSPLASIA OR MALIGNANCY.   06/03/2020 Imaging   MRI Pelvis  IMPRESSION: No evidence of residual/recurrent or metastatic disease within the pelvis.   10/27/2020 Procedure   Colonoscopy by Dr Rush Landmark  IMPRESSION - Hemorrhoids found on digital rectal exam. - Two 2 to 3  mm polyps in the ascending colon and in the cecum. - Normal mucosa in the entire examined colon. - Diverticulosis in the recto-sigmoid colon and in the sigmoid colon. - Non-bleeding non-thrombosed external and internal hemorrhoids.     Diagnosis Surgical  [P], colon, ascending, cecum, polyps (2) - TUBULAR ADENOMA (1 FRAGMENTS) - MULTIPLE FRAGMENTS OF BENIGN COLONIC MUCOSA - NO HIGH GRADE DYSPLASIA OR MALIGNANCY IDENTIFIED   02/03/2021 Imaging   CT CAP  IMPRESSION: 1. Post treatment change in the rectal wall without overt mass like lesion. 2. No evidence of metastatic disease within the chest abdomen or pelvis. 3. Hepatic steatosis. 4. Cholelithiasis. 5. Aortic atherosclerosis.   Aortic Atherosclerosis (ICD10-I70.0).      CURRENT THERAPY: Surveillance  INTERVAL HISTORY: [name] returns for follow up.  She was last seen by Dr. Burr Medico on 02/07/2021.  She called and asked for a visit due to bowel changes.  Her bowels have been regular once daily in the morning.  She went to [place] in June and recently got dentures that do not fit.  She has had to change her diet now to mostly soft foods.  She started atorvastatin when she returned and recently ate spicy [place] food.  Since all this she has had more frequent stools, 2 in the morning and 1 in the evening, once she had 6 bowel movements a day with an episode of left lower quadrant pain.  Her chronic right low back pain flares but resolves after bowel movement.  She denies other abdominal or rectal pain.  Denies hematochezia or melena.  She uses liquid zinc in her dentures and this causes nausea, no vomiting.  Denies unintentional weight loss or new fatigue.    All other systems were reviewed with the patient and are negative.  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  ALLERGIES:  is allergic to morphine and related and other.  MEDICATIONS:  Current Outpatient Medications  Medication Sig Dispense Refill   acetaminophen (TYLENOL) 500 MG tablet Take 500 mg by mouth every 6 (six) hours as needed for moderate pain or mild pain.     atorvastatin (LIPITOR) 20 MG tablet Take 1 tablet (20 mg total) by mouth daily. 90 tablet 3   calcium carbonate (OS-CAL) 600 MG TABS tablet Take 600 mg by mouth daily with breakfast.     polycarbophil (FIBERCON) 625 MG tablet Take 625 mg by mouth daily.     Vitamin D, Ergocalciferol, (DRISDOL) 1.25 MG (50000 UNIT) CAPS capsule Take 1 capsule (50,000 Units total) by mouth every  7 (seven) days. 5 capsule 3   Vitamin D, Ergocalciferol, (DRISDOL) 1.25 MG (50000 UNIT) CAPS capsule Take 1 capsule (50,000 Units total) by mouth every 7 (seven) days. 12 capsule 0   No current facility-administered medications for this visit.    PHYSICAL EXAMINATION:  Vitals:   06/07/21 0947  BP: 128/65  Pulse: 65  Resp: 17  Temp: 97.6 F (36.4 C)  SpO2: 99%   Filed Weights   06/07/21 0947  Weight: 146 lb 9.6 oz (66.5 kg)    GENERAL:alert, no distress and comfortable SKIN: no rash  EYES: sclera clear NECK: without mass LYMPH:  no palpable cervical or inguinal lymphadenopathy  LUNGS: clear with normal breathing effort HEART: regular rate & rhythm, no lower extremity edema ABDOMEN:abdomen soft, non-tender and normal bowel sounds Musculoskeletal: Mild TTP right back/flank NEURO: alert & oriented x 3 with fluent speech, no focal motor/sensory deficits Rectal: Patient  refused rectal exam  LABORATORY DATA:  I have reviewed the data as listed CBC Latest Ref Rng & Units 06/07/2021 04/07/2021 02/03/2021  WBC 4.0 - 10.5 K/uL 4.5 3.9(L) 5.5  Hemoglobin 12.0 - 15.0 g/dL 12.4 12.6 12.9  Hematocrit 36.0 - 46.0 % 37.1 36.8 38.7  Platelets 150 - 400 K/uL 238 249.0 265     CMP Latest Ref Rng & Units 06/07/2021 04/07/2021 02/03/2021  Glucose 70 - 99 mg/dL 110(H) 92 98  BUN 8 - 23 mg/dL 12 11 12   Creatinine 0.44 - 1.00 mg/dL 0.73 0.67 0.81  Sodium 135 - 145 mmol/L 142 139 142  Potassium 3.5 - 5.1 mmol/L 4.0 4.8 4.1  Chloride 98 - 111 mmol/L 106 105 106  CO2 22 - 32 mmol/L 29 28 27   Calcium 8.9 - 10.3 mg/dL 10.2 9.8 10.3  Total Protein 6.5 - 8.1 g/dL 7.1 - 7.5  Total Bilirubin 0.3 - 1.2 mg/dL 0.7 - 0.5  Alkaline Phos 38 - 126 U/L 37(L) - 36(L)  AST 15 - 41 U/L 17 12 15   ALT 0 - 44 U/L 15 8 10       RADIOGRAPHIC STUDIES: I have personally reviewed the radiological images as listed and agreed with the findings in the report. No results found.   ASSESSMENT & PLAN: 75 year old female  1.Bowel change -frequent BM, not diarrhea  -onset: after returning from a trip in June, starting statin, and altering diet due to poorly fitting dentures -she was having up to 6 BM per day, now 2 in AM and 1 at night -exam benign, pt refused rectal exam due to needing to have BM and always showers after. I offered to wait while she has BM then perform exam but she declined.  -CBC and CMP WBL except BG 110, CEA is pending  -I suspect this is likely diet and medication (statin) related. Low suspicion for cancer recurrence -hold statin -return to dentist to fit dentures then return to normal/regular diet -phone call in 3 weeks for close f/up. If symptoms persist will consider further work up  2.  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  She declined surgery.  On surveillance -Colonoscopy 12/21  showed hemorrhoid, diverticulosis, and 2 benign polyps.  Plan to repeat 10/2021 -Surveillance CT 01/2021 showed no evidence of recurrent or metastatic disease -Port in place, continue flushes every 2 months -next routine surveillance f/up in 07/2021   3.Osteoporosis, chronic back pain -On Prolia, held due to extensive dental work.  Dentures were delivered but do not  fit, has had to alter her diet -Chronic right back pain is stable, flares prior to BM then resolves -Mild TTP to right low/mid back, likely MSK related -Pain managed with lidocaine patches and tramadol for severe pain  PLAN: -Labs reviewed, CEA is pending -Hold statin -Return to dentist for fit dentures, then resume normal/regular diet -Phone f/up in 3 weeks -Next surveillance visit and port flush in 07/2021   All questions were answered. The patient knows to call the clinic with any problems, questions or concerns. No barriers to learning were detected. Total encounter time was 30 minutes.       Sherry Feeling, NP 06/07/21

## 2021-06-08 ENCOUNTER — Telehealth: Payer: Self-pay

## 2021-06-08 NOTE — Telephone Encounter (Signed)
Negative.  Will not be accepting this new case.  Thanks.

## 2021-06-17 ENCOUNTER — Other Ambulatory Visit: Payer: Self-pay

## 2021-06-17 ENCOUNTER — Encounter (HOSPITAL_COMMUNITY): Payer: Self-pay

## 2021-06-17 ENCOUNTER — Ambulatory Visit (HOSPITAL_COMMUNITY)
Admission: EM | Admit: 2021-06-17 | Discharge: 2021-06-17 | Disposition: A | Discharge status: Home / Self Care | Payer: Medicaid Other | Attending: Family Medicine | Admitting: Family Medicine

## 2021-06-17 DIAGNOSIS — R531 Weakness: Secondary | ICD-10-CM

## 2021-06-17 DIAGNOSIS — R5383 Other fatigue: Secondary | ICD-10-CM

## 2021-06-17 DIAGNOSIS — R112 Nausea with vomiting, unspecified: Secondary | ICD-10-CM

## 2021-06-17 DIAGNOSIS — C20 Malignant neoplasm of rectum: Secondary | ICD-10-CM | POA: Insufficient documentation

## 2021-06-17 DIAGNOSIS — R197 Diarrhea, unspecified: Secondary | ICD-10-CM | POA: Diagnosis not present

## 2021-06-17 LAB — POCT URINALYSIS DIPSTICK, ED / UC
Bilirubin Urine: NEGATIVE
Glucose, UA: NEGATIVE mg/dL
Ketones, ur: 40 mg/dL — AB
Leukocytes,Ua: NEGATIVE
Nitrite: NEGATIVE
Protein, ur: NEGATIVE mg/dL
Specific Gravity, Urine: 1.015 (ref 1.005–1.030)
Urobilinogen, UA: 0.2 mg/dL (ref 0.0–1.0)
pH: 5.5 (ref 5.0–8.0)

## 2021-06-17 LAB — CBC WITH DIFFERENTIAL/PLATELET
Abs Immature Granulocytes: 0.01 10*3/uL (ref 0.00–0.07)
Basophils Absolute: 0 10*3/uL (ref 0.0–0.1)
Basophils Relative: 0 %
Eosinophils Absolute: 0 10*3/uL (ref 0.0–0.5)
Eosinophils Relative: 0 %
HCT: 39.8 % (ref 36.0–46.0)
Hemoglobin: 13.4 g/dL (ref 12.0–15.0)
Immature Granulocytes: 0 %
Lymphocytes Relative: 28 %
Lymphs Abs: 1.1 10*3/uL (ref 0.7–4.0)
MCH: 29 pg (ref 26.0–34.0)
MCHC: 33.7 g/dL (ref 30.0–36.0)
MCV: 86.1 fL (ref 80.0–100.0)
Monocytes Absolute: 0.8 10*3/uL (ref 0.1–1.0)
Monocytes Relative: 22 %
Neutro Abs: 1.9 10*3/uL (ref 1.7–7.7)
Neutrophils Relative %: 50 %
Platelets: 183 10*3/uL (ref 150–400)
RBC: 4.62 MIL/uL (ref 3.87–5.11)
RDW: 13.6 % (ref 11.5–15.5)
WBC: 3.8 10*3/uL — ABNORMAL LOW (ref 4.0–10.5)
nRBC: 0 % (ref 0.0–0.2)

## 2021-06-17 LAB — COMPREHENSIVE METABOLIC PANEL
ALT: 18 U/L (ref 0–44)
AST: 23 U/L (ref 15–41)
Albumin: 4 g/dL (ref 3.5–5.0)
Alkaline Phosphatase: 35 U/L — ABNORMAL LOW (ref 38–126)
Anion gap: 9 (ref 5–15)
BUN: 8 mg/dL (ref 8–23)
CO2: 25 mmol/L (ref 22–32)
Calcium: 9.9 mg/dL (ref 8.9–10.3)
Chloride: 104 mmol/L (ref 98–111)
Creatinine, Ser: 0.94 mg/dL (ref 0.44–1.00)
GFR, Estimated: >60 mL/min (ref >60)
Glucose, Bld: 102 mg/dL — ABNORMAL HIGH (ref 70–99)
Potassium: 4.2 mmol/L (ref 3.5–5.1)
Sodium: 138 mmol/L (ref 135–145)
Total Bilirubin: 0.3 mg/dL (ref 0.3–1.2)
Total Protein: 7 g/dL (ref 6.5–8.1)

## 2021-06-17 MED ORDER — ONDANSETRON 4 MG PO TBDP: 4.0000 mg | ORAL_TABLET | Freq: Three times a day (TID) | ORAL | 0 refills | Status: DC | PRN

## 2021-06-17 NOTE — ED Triage Notes (Signed)
Pt presents with nausea, vomiting, diarrhea, and ongoing headache X 1 week.

## 2021-06-18 NOTE — ED Provider Notes (Signed)
[place]    CSN: [id number] Arrival date & time: 06/17/21  [id number]      History   Chief Complaint Chief Complaint  Patient presents with   Emesis   Diarrhea   Headache    HPI [name] is a 75 y.o. female.   Patient presenting today with nausea, vomiting, diarrhea, mild headache and malaise for about a week now.  She states her stools are frequent and very watery, sometimes incontinent which is not abnormal for her since her rectal cancer.  She states she has been having these GI issues since her dental procedures several weeks ago off and on.  She feels the anesthesia has caused her to have significant and ongoing side effects and additionally the denture glue and mouthwash causes bad nausea.  She denies fever, abdominal pain, dysuria, hematuria, new foods or medications, recent travel.  Does have a history of rectal cancer in remission but still receiving infusions through her port, no recent changes to this regimen.  Not taking anything currently for symptoms.  Has been able to tolerate toast but otherwise not able to keep food down.   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      Home Medications    Prior to Admission medications   Medication Sig Start Date End Date Taking? Authorizing Provider  ondansetron (ZOFRAN ODT) 4 MG disintegrating tablet Take 1 tablet (4 mg total) by mouth every 8 (eight) hours as needed for nausea or vomiting. 06/17/21  Yes Volney American, PA-C  acetaminophen (TYLENOL) 500 MG tablet Take 500 mg by mouth every 6 (six) hours as needed for moderate pain or mild pain.    [provider]  atorvastatin (LIPITOR) 20 MG tablet Take 1 tablet (20 mg total) by mouth daily. 04/13/21   Cirigliano, Garvin Fila, DO  calcium carbonate (OS-CAL) 600 MG TABS tablet Take 600 mg by mouth daily with breakfast.    [provider]  polycarbophil (FIBERCON) 625 MG tablet Take 625 mg by mouth daily.    [provider]  Vitamin D, Ergocalciferol, (DRISDOL) 1.25 MG (50000 UNIT) CAPS capsule Take 1 capsule (50,000 Units total) by mouth every 7 (seven) days. 04/14/21   Cirigliano, Garvin Fila, DO  Vitamin D, Ergocalciferol, (DRISDOL) 1.25 MG (50000 UNIT) CAPS capsule Take 1 capsule (50,000 Units total) by mouth every 7 (seven) days. 04/14/21  Ronnald Nian, DO    Family History Family History  Problem Relation Age of Onset   Cancer Mother        head and neck cancer    Cancer Sister        lung cancer    Cancer Brother        pancreatic cancer    Pancreatic cancer Brother    Cancer Paternal Grandmother        breast cancer    Colon cancer Neg Hx    Esophageal cancer Neg Hx    Rectal cancer Neg Hx    Stomach cancer Neg Hx    Inflammatory bowel disease Neg Hx    Liver disease Neg Hx     Social History Social History   Tobacco Use   Smoking status: Former    Packs/day: 2.00    Years: 26.00    Pack years: 52.00    Types: Cigarettes    Quit date: 11/21/1987    Years since quitting: 33.5   Smokeless  tobacco: Never  Vaping Use   Vaping Use: Never used  Substance Use Topics   Alcohol use: Yes    Alcohol/week: 1.0 standard drink    Types: 1 Glasses of wine per week    Comment: wine socially   Drug use: Yes    Types: Other-see comments    Comment: CBD     Allergies   Morphine and related and Other   Review of Systems Review of Systems Per HPI  Physical Exam Triage Vital Signs ED Triage Vitals  Enc Vitals Group     BP 06/17/21 1007 128/86     Pulse Rate 06/17/21 1007 78     Resp 06/17/21 1007 18     Temp 06/17/21 1007 98.4 F (36.9 C)     Temp Source 06/17/21 1007 Oral     SpO2 06/17/21 1007 97 %     Weight --      Height --      Head Circumference --      Peak Flow --      Pain Score 06/17/21 1009 5     Pain Loc --      Pain Edu? --      Excl. in [place]? --    No data found.  Updated Vital Signs BP 128/86 (BP Location: Right Arm)   Pulse 78   Temp 98.4 F (36.9 C) (Oral)   Resp 18   SpO2 97%   Visual Acuity Right Eye Distance:   Left Eye Distance:   Bilateral Distance:    Right Eye Near:   Left Eye Near:    Bilateral Near:     Physical Exam Vitals and nursing note reviewed.  Constitutional:      Appearance: Normal appearance. She is not ill-appearing.  HENT:     Head: Atraumatic.     Mouth/Throat:     Mouth: Mucous membranes are moist.     Pharynx: No oropharyngeal exudate or posterior oropharyngeal erythema.  Eyes:     Extraocular Movements: Extraocular movements intact.     Conjunctiva/sclera: Conjunctivae normal.  Cardiovascular:     Rate and Rhythm: Normal rate and regular rhythm.     Heart sounds: Normal heart sounds.  Pulmonary:     Effort: Pulmonary effort is normal.     Breath sounds: Normal breath sounds.  Abdominal:     General: Bowel sounds are normal. There is no distension.     Palpations: Abdomen is  soft.     Tenderness: There is no abdominal tenderness. There is no right CVA tenderness, left CVA tenderness or guarding.   Musculoskeletal:        General: Normal range of motion.     Cervical back: Normal range of motion and neck supple.  Skin:    General: Skin is warm and dry.  Neurological:     Mental Status: She is alert and oriented to person, place, and time.     Sensory: No sensory deficit.     Motor: No weakness.     Gait: Gait normal.  Psychiatric:        Mood and Affect: Mood normal.        Thought Content: Thought content normal.        Judgment: Judgment normal.     UC Treatments / Results  Labs (all labs ordered are listed, but only abnormal results are displayed) Labs Reviewed  CBC WITH DIFFERENTIAL/PLATELET - Abnormal; Notable for the following components:      Result Value   WBC 3.8 (*)    All other components within normal limits  COMPREHENSIVE METABOLIC PANEL - Abnormal; Notable for the following components:   Glucose, Bld 102 (*)    Alkaline Phosphatase 35 (*)    All other components within normal limits  POCT URINALYSIS DIPSTICK, ED / UC - Abnormal; Notable for the following components:   Ketones, ur 40 (*)    Hgb urine dipstick SMALL (*)    All other components within normal limits  GASTROINTESTINAL PANEL BY PCR, STOOL (REPLACES STOOL CULTURE)    EKG   Radiology No results found.  Procedures Procedures (including critical care time)  Medications Ordered in UC Medications - No data to display  Initial Impression / Assessment and Plan / UC Course  I have reviewed the triage vital signs and the nursing notes.  Pertinent labs & imaging results that were available during my care of the patient were reviewed by me and considered in my medical decision making (see chart for details).     Vital signs all benign reassuring, exam overall without significant abnormal findings.  Abdomen nondistended, nontender to palpation with active bowel sounds in all quadrants.  Her urinalysis does not reveal a urinary tract infection or other concerning findings.  Basic labs pending  for rule out of of other abnormalities.  Will give Zofran, await stool studies which she will have to bring back once collected.  Brat diet, fluids recommended.  Follow-up if worsening or not resolving.  Final Clinical Impressions(s) / UC Diagnoses   Final diagnoses:  Nausea vomiting and diarrhea  Weakness  Fatigue, unspecified type  Rectal cancer [facility])   Discharge Instructions   None    ED Prescriptions     Medication Sig Dispense Auth. Provider   ondansetron (ZOFRAN ODT) 4 MG disintegrating tablet Take 1 tablet (4 mg total) by mouth every 8 (eight) hours as needed for nausea or vomiting. 20 tablet Volney American, [place]      PDMP not reviewed this encounter.   Merrie Roof [place], [place] 06/18/21 [phone]

## 2021-06-20 ENCOUNTER — Inpatient Hospital Stay (HOSPITAL_BASED_OUTPATIENT_CLINIC_OR_DEPARTMENT_OTHER): Payer: Medicaid Other | Admitting: Hematology

## 2021-06-20 ENCOUNTER — Inpatient Hospital Stay: Payer: Medicaid Other | Attending: Hematology

## 2021-06-20 ENCOUNTER — Telehealth: Payer: Self-pay

## 2021-06-20 ENCOUNTER — Encounter: Payer: Self-pay | Admitting: Hematology

## 2021-06-20 ENCOUNTER — Other Ambulatory Visit: Payer: Self-pay

## 2021-06-20 DIAGNOSIS — C20 Malignant neoplasm of rectum: Secondary | ICD-10-CM | POA: Insufficient documentation

## 2021-06-20 DIAGNOSIS — R112 Nausea with vomiting, unspecified: Secondary | ICD-10-CM

## 2021-06-20 DIAGNOSIS — R197 Diarrhea, unspecified: Secondary | ICD-10-CM | POA: Insufficient documentation

## 2021-06-20 DIAGNOSIS — E860 Dehydration: Secondary | ICD-10-CM | POA: Insufficient documentation

## 2021-06-20 MED ORDER — SODIUM CHLORIDE 0.9 % IV SOLN
Freq: Once | INTRAVENOUS | Status: AC
  Filled 2021-06-20: qty 250

## 2021-06-20 NOTE — Telephone Encounter (Signed)
This nurse spoke with patient and informed that Dr. Burr Medico is recommending IV fluids today.  Patient is in agreement and stated that she can arrive by 330.  No further questions concerns.

## 2021-06-20 NOTE — Progress Notes (Signed)
[place]   [phone] Fax:([phone]   Clinic Follow up Note   Patient Care Team: Horald Pollen, MD as PCP - General (Internal Medicine) Truitt Merle, MD as PCP - Hematology/Oncology (Hematology) Mansouraty, Telford Nab., MD as Consulting Physician (Gastroenterology)  Date of Service:  06/20/2021  CHIEF COMPLAINT: nausea, and diarrhea   SUMMARY OF ONCOLOGIC HISTORY: Oncology History Overview Note  Cancer Staging Rectal cancer [facility]) Staging form: Colon and Rectum, AJCC 8th Edition - Clinical stage from 05/11/2017: cT3, cN2, cM0 - Signed by Truitt Merle, MD on 08/25/2019     Rectal cancer ([place])  05/11/2017 Cancer Staging   Staging form: Colon and Rectum, AJCC 8th Edition - Clinical stage from 05/11/2017: cT3, cN2, cM0 - Signed by Truitt Merle, MD on 08/25/2019    05/11/2017 Procedure   Colonoscopy by Dr Crissie Reese at [facility] -Palpable rectal mass found on digital rectal exam  -Likely malignant tumor in the rectum. Biopsied.  -One 6 mm polyp in the sigmoid colon, removed with a cold snare, resected and retrieved.  -Diverticulosis at the hepatic flexure and in the ascending colon -the examined portion of the ileum was normal.    09/10/2017 Initial Biopsy   Final Diagnosis  Sigmoid Polyp biopsies:  Tubular Adenoma  2. Rectal Mass biopsies:  Moderately differentiated adenocarcinoma, invasive.  The depth of the invasion cannot be assessed in this biopsy specimen.     - 06/29/2017 Chemotherapy   She received neoadjuvant infusional 5FU with concurrent radiation.     09/17/2017 Procedure   Sigmoidoscopy by Dr. Tami Ribas at [facility] on 09/17/17  was noted to have complete response from chemoRT   10/31/2017 - 01/2018 Chemotherapy   Consolidation Chemo 5FU and Leucovorin. Oxaliplatin was deferred due to pre-existing neuropathy. Pt declined rectal surgery.    01/13/2019 Imaging   CT CAP W Contrast at [facility]  show persistent and stable 24m nodule in the right middle lobe and stable 244mnodule in the superior segment of the left lower lobe. No new or suspect nodules   Cholelithiasis without evidence of cholecystitis.   Mild compression of the superior plate of L5, Stable   Overall no evidence of disease recurrence in the chest, abdomen or pelvis.    08/25/2019 Initial Diagnosis   [place] [place]   09/08/2019 Imaging   CT CAP W contrast 09/08/19  IMPRESSION: 1. Areas of mild rectal wall thickening, likely radiation change. No discrete rectal mass is identified. No perirectal or pelvic or retroperitoneal lymphadenopathy. 2. No findings suspicious for metastatic disease involving the liver or lungs. 3. Cholelithiasis.   10/07/2019 Procedure   Colonoscopy by Dr. MaSilvestre Moment1/17/20 IMPRESSION - Hemorrhoids found on digital rectal exam. - The examined portion of the ileum was normal. - One 4 mm polyp in the ascending colon, removed with a cold snare. Resected and retrieved. - A single colonic angioectasia. - Scar in the distal rectum. - Diverticulosis in the recto-sigmoid colon, in the sigmoid colon, in the descending colon and at the hepatic flexure. - Normal mucosa in the entire examined colon otherwise. - Non-bleeding non-thrombosed external and internal hemorrhoids. Diagnosis Surgical [P], colon, ascending, polyp - BENIGN COLONIC MUCOSA WITH LYMPHOID AGGREGATE. - NO DYSPLASIA OR MALIGNANCY.   06/03/2020 Imaging   MRI Pelvis  IMPRESSION: No evidence of residual/recurrent or metastatic disease within the pelvis.   10/27/2020 Procedure   Colonoscopy by Dr MaRush LandmarkIMPRESSION - Hemorrhoids found on digital rectal exam. -  Two 2 to 3 mm polyps in the ascending colon and in the cecum. - Normal mucosa in the entire examined colon. - Diverticulosis in the recto-sigmoid colon and in the sigmoid colon. - Non-bleeding non-thrombosed external and internal  hemorrhoids.     Diagnosis Surgical [P], colon, ascending, cecum, polyps (2) - TUBULAR ADENOMA (1 FRAGMENTS) - MULTIPLE FRAGMENTS OF BENIGN COLONIC MUCOSA - NO HIGH GRADE DYSPLASIA OR MALIGNANCY IDENTIFIED   02/03/2021 Imaging   CT CAP  IMPRESSION: 1. Post treatment change in the rectal wall without overt mass like lesion. 2. No evidence of metastatic disease within the chest abdomen or pelvis. 3. Hepatic steatosis. 4. Cholelithiasis. 5. Aortic atherosclerosis.   Aortic Atherosclerosis (ICD10-I70.0).       CURRENT THERAPY:  Surveillance  INTERVAL HISTORY:  [name] is here for an urgent visit and IV fluids. She was last seen by me on 02/07/21 and by NP [name] in the interim. She was evaluated in the infusion area. She was seen by [name] on 06/07/21 for diarrhea. Several days later, she developed vomiting and presented to the ED on 06/17/21. She reports the diarrhea got so bad that she had liquid stool every time she went to the bathroom to urinate. Because of this, she has become dehydrated. She notes she is passing gas but has been constipated since her urgent care visit. She reports she is not good about drinking water. She also reports dizziness for the last week.  About 2-3 days ago, she had whole body pain that led her to take a tramadol. The pain resolved with tramadol and has not recurred yet. She also reports she has been having a lot of cramps. She notes her diet has had to change since she had work done on her teeth. She reports a burning sensation, pointing to the area below her diaphragm.   All other systems were reviewed with the patient and are negative.  MEDICAL HISTORY:  Past Medical History:  Diagnosis Date   Anxiety    Arthritis    Cataract    Colon cancer ([place])    History of kidney stones     in system   Kidney stones    Macular degeneration    Neuromuscular disorder ([place])    Osteoporosis    Rectal cancer ([place])    Schwannoma of spinal  cord ([place])     SURGICAL HISTORY: Past Surgical History:  Procedure Laterality Date   ABDOMINAL HYSTERECTOMY     COLONOSCOPY  10/07/2019   EYE SURGERY Bilateral    cataract   SPINE SURGERY  2017   schwannoma of spinal cord   TONSILLECTOMY     TOOTH EXTRACTION N/A 11/26/2020   Procedure: DENTAL RESTORATION/EXTRACTIONS;  Surgeon: Diona Browner, DMD;  Location: [place];  Service: Oral Surgery;  Laterality: N/A;    I have reviewed the social history and family history with the patient and they are unchanged from previous note.  ALLERGIES:  is allergic to morphine and related and other.  MEDICATIONS:  Current Outpatient Medications  Medication Sig Dispense Refill   acetaminophen (TYLENOL) 500 MG tablet Take 500 mg by mouth every 6 (six) hours as needed for moderate pain or mild pain.     atorvastatin (LIPITOR) 20 MG tablet Take 1 tablet (20 mg total) by mouth daily. (Patient not taking: Reported on 06/20/2021) 90 tablet 3   calcium carbonate (OS-CAL) 600 MG TABS tablet Take 600 mg by mouth daily with breakfast.     ondansetron (ZOFRAN ODT) 4  MG disintegrating tablet Take 1 tablet (4 mg total) by mouth every 8 (eight) hours as needed for nausea or vomiting. 20 tablet 0   polycarbophil (FIBERCON) 625 MG tablet Take 625 mg by mouth daily.     No current facility-administered medications for this visit.    PHYSICAL EXAMINATION: ECOG PERFORMANCE STATUS: 2 - Symptomatic, <50% confined to bed  There were no vitals filed for this visit. Wt Readings from Last 3 Encounters:  06/07/21 146 lb 9.6 oz (66.5 kg)  03/30/21 144 lb 3.2 oz (65.4 kg)  02/18/21 144 lb 3.2 oz (65.4 kg)     GENERAL:alert, no distress and comfortable SKIN: skin color, texture, turgor are normal, no rashes or significant lesions EYES: normal, Conjunctiva are pink and non-injected, sclera clear  LUNGS: clear to auscultation and percussion with normal breathing effort HEART: regular rate & rhythm and no murmurs and no lower  extremity edema ABDOMEN:abdomen soft, non-tender and normal bowel sounds Musculoskeletal:no cyanosis of digits and no clubbing  NEURO: alert & oriented x 3 with fluent speech, no focal motor/sensory deficits  LABORATORY DATA:  I have reviewed the data as listed CBC Latest Ref Rng & Units 06/17/2021 06/07/2021 04/07/2021  WBC 4.0 - 10.5 K/uL 3.8(L) 4.5 3.9(L)  Hemoglobin 12.0 - 15.0 g/dL 13.4 12.4 12.6  Hematocrit 36.0 - 46.0 % 39.8 37.1 36.8  Platelets 150 - 400 K/uL 183 238 249.0     CMP Latest Ref Rng & Units 06/17/2021 06/07/2021 04/07/2021  Glucose 70 - 99 mg/dL 102(H) 110(H) 92  BUN 8 - 23 mg/dL '8 12 11  '$ Creatinine 0.44 - 1.00 mg/dL 0.94 0.73 0.67  Sodium 135 - 145 mmol/L 138 142 139  Potassium 3.5 - 5.1 mmol/L 4.2 4.0 4.8  Chloride 98 - 111 mmol/L 104 106 105  CO2 22 - 32 mmol/L '25 29 28  '$ Calcium 8.9 - 10.3 mg/dL 9.9 10.2 9.8  Total Protein 6.5 - 8.1 g/dL 7.0 7.1 -  Total Bilirubin 0.3 - 1.2 mg/dL 0.3 0.7 -  Alkaline Phos 38 - 126 U/L 35(L) 37(L) -  AST 15 - 41 U/L '23 17 12  '$ ALT 0 - 44 U/L '18 15 8      '$ RADIOGRAPHIC STUDIES: I have personally reviewed the radiological images as listed and agreed with the findings in the report. No results found.   ASSESSMENT & PLAN:  Waunetta Trundle is a 75 y.o. female with   1. Diarrhea, nausea with vomiting, dehydration, body pain -she reports onset after her diet changed following dental work and starting statin. She started having loose bowel movements and saw NP [name] on 06/07/21. This worsened within a week to constant diarrhea.  -She also developed nausea with vomiting, which led to dehydration and dizziness. She presented to urgent care on 06/17/21. Lab was unremarkable  -She reports taking imodium after her visit and has not had a bowel movement since, but she has flatus  -She is still dealing with nausea. -I recommended she come in today for IVF. Will give NS 1L over 2 hrs  -She does not appear to be toxic, no stool sample  available for test -Not sure if this is food poisoning (the long clinical course is not typical) or viral gastritis.  It seems she is improving.  Continue supportive care -last Colonoscopy in December 2021.  If her symptom does not improve in the next few weeks, will refer her back to GI -I reviewed antiemetics    2.  Rectal cancer, stage IIIb -  Diagnosed 04/2017, treated with concurrent chemo RT and consolidation 5-FU/leucovorin for 3 months.  She declined surgery.  On surveillance -Colonoscopy 12/21 showed hemorrhoid, diverticulosis, and 2 benign polyps.  Plan to repeat 10/2021 -Surveillance CT 01/2021 showed no evidence of recurrent or metastatic disease -Port in place, continue flushes every 2 months -next routine surveillance f/up in 07/2021   3.Osteoporosis, chronic back pain -On Prolia, held due to extensive dental work.  Dentures were delivered but do not fit, has had to alter her diet -Chronic right back pain is stable, flares prior to BM then resolves -Mild TTP to right low/mid back, likely MSK related -Pain managed with lidocaine patches and tramadol for severe pain    PLAN: -1L NS over 2 hrs today -same IVF in 2-3 days  -next f/u is scheduled with NP [name] on 8/10, if no improvement,will refer her back to GI Dr.Mansouraty     No problem-specific Assessment & Plan notes found for this encounter.   No orders of the defined types were placed in this encounter.  All questions were answered. The patient knows to call the clinic with any problems, questions or concerns. No barriers to learning was detected. The total time spent in the appointment was 25 minutes.     Truitt Merle, MD 06/20/2021   I, [name], am acting as scribe for Truitt Merle, MD.   I have reviewed the above documentation for accuracy and completeness, and I agree with the above.

## 2021-06-20 NOTE — Telephone Encounter (Signed)
This nurse spoke with patient states that she has been experiencing nausea, vomiting, and diarrhea for about 2 weeks now.  Patient reported to urgent care on 7/29.  Patient states that she continued to have diarrhea until 7/30 after she took some Imodium and Vomiting subsided on 7/31.  Patient states that she is still dealing with nausea and has only today been able to keep down a little oatmeal and some saltines, she has also been able to keep down Stephen.  Patient states that she feels so weak and it has become difficult to walk.  This nurse informed patient that this information will be forwarded to the MD for recommendations.   Encouraged to drink clear fluids as much as possible and only eat bland foods as tolerated.

## 2021-06-20 NOTE — Addendum Note (Signed)
Addended by: Truitt Merle on: 06/20/2021 11:55 PM   Modules accepted: Orders

## 2021-06-20 NOTE — Patient Instructions (Signed)
[facility]  Discharge Instructions: Thank you for choosing [facility] to provide your oncology and hematology care.   If you have a lab appointment with the [facility], please go directly to the [facility] and check in at the registration area.   Wear comfortable clothing and clothing appropriate for easy access to any Portacath or PICC line.   We strive to give you quality time with your provider. You may need to reschedule your appointment if you arrive late (15 or more minutes).  Arriving late affects you and other patients whose appointments are after yours.  Also, if you miss three or more appointments without notifying the office, you may be dismissed from the clinic at the provider's discretion.      For prescription refill requests, have your pharmacy contact our office and allow 72 hours for refills to be completed.    Today you received the following chemotherapy and/or immunotherapy agents IV hydration      To help prevent nausea and vomiting after your treatment, we encourage you to take your nausea medication as directed.  BELOW ARE SYMPTOMS THAT SHOULD BE REPORTED IMMEDIATELY: *FEVER GREATER THAN 100.4 F (38 C) OR HIGHER *CHILLS OR SWEATING *NAUSEA AND VOMITING THAT IS NOT CONTROLLED WITH YOUR NAUSEA MEDICATION *UNUSUAL SHORTNESS OF BREATH *UNUSUAL BRUISING OR BLEEDING *URINARY PROBLEMS (pain or burning when urinating, or frequent urination) *BOWEL PROBLEMS (unusual diarrhea, constipation, pain near the anus) TENDERNESS IN MOUTH AND THROAT WITH OR WITHOUT PRESENCE OF ULCERS (sore throat, sores in mouth, or a toothache) UNUSUAL RASH, SWELLING OR PAIN  UNUSUAL VAGINAL DISCHARGE OR ITCHING   Items with * indicate a potential emergency and should be followed up as soon as possible or go to the Emergency Department if any problems should occur.  Please show the CHEMOTHERAPY ALERT CARD or IMMUNOTHERAPY ALERT CARD at check-in  to the Emergency Department and triage nurse.  Should you have questions after your visit or need to cancel or reschedule your appointment, please contact [facility]  Dept: [phone]  and follow the prompts.  Office hours are 8:00 a.m. to 4:30 p.m. Monday - Friday. Please note that voicemails left after 4:00 p.m. may not be returned until the following business day.  We are closed weekends and major holidays. You have access to a nurse at all times for urgent questions. Please call the main number to the clinic Dept: [phone] and follow the prompts.   For any non-urgent questions, you may also contact your provider using MyChart. We now offer e-Visits for anyone 68 and older to request care online for non-urgent symptoms. For details visit [email or web address].   Also download the MyChart app! Go to the app store, search "MyChart", open the app, select [facility], and log in with your MyChart username and password.  Due to Covid, a mask is required upon entering the hospital/clinic. If you do not have a mask, one will be given to you upon arrival. For doctor visits, patients may have 1 support person aged 51 or older with them. For treatment visits, patients cannot have anyone with them due to current Covid guidelines and our immunocompromised population.

## 2021-06-22 ENCOUNTER — Encounter: Payer: Self-pay | Admitting: Hematology

## 2021-06-22 NOTE — Telephone Encounter (Signed)
This nurse attempted to follow up with patient related to loose stools.  No answer at this time.

## 2021-06-23 ENCOUNTER — Other Ambulatory Visit: Payer: Self-pay | Admitting: Hematology

## 2021-06-23 ENCOUNTER — Inpatient Hospital Stay: Payer: Medicaid Other

## 2021-06-23 ENCOUNTER — Other Ambulatory Visit: Payer: Self-pay

## 2021-06-23 VITALS — BP 137/64 | HR 63 | Temp 98.6°F

## 2021-06-23 DIAGNOSIS — Z95828 Presence of other vascular implants and grafts: Secondary | ICD-10-CM

## 2021-06-23 DIAGNOSIS — E860 Dehydration: Secondary | ICD-10-CM | POA: Diagnosis not present

## 2021-06-23 DIAGNOSIS — M810 Age-related osteoporosis without current pathological fracture: Secondary | ICD-10-CM

## 2021-06-23 MED ORDER — EPINEPHRINE 1 MG/10ML IJ SOSY
0.2500 mg | PREFILLED_SYRINGE | Freq: Once | INTRAMUSCULAR | Status: DC | PRN
  Filled 2021-06-23: qty 10

## 2021-06-23 MED ORDER — ALBUTEROL SULFATE (2.5 MG/3ML) 0.083% IN NEBU
2.5000 mg | INHALATION_SOLUTION | Freq: Once | RESPIRATORY_TRACT | Status: DC | PRN
  Filled 2021-06-23: qty 3

## 2021-06-23 MED ORDER — DIPHENHYDRAMINE HCL 50 MG/ML IJ SOLN: 50.0000 mg | Freq: Once | INTRAMUSCULAR | Status: DC | PRN

## 2021-06-23 MED ORDER — SODIUM CHLORIDE 0.9 % IV SOLN
Freq: Once | INTRAVENOUS | Status: DC
  Filled 2021-06-23: qty 250

## 2021-06-23 MED ORDER — METHYLPREDNISOLONE SODIUM SUCC 125 MG IJ SOLR: 125.0000 mg | Freq: Once | INTRAMUSCULAR | Status: DC | PRN

## 2021-06-23 MED ORDER — SODIUM CHLORIDE 0.9 % IV SOLN
Freq: Once | INTRAVENOUS | Status: AC
  Filled 2021-06-23: qty 250

## 2021-06-23 MED ORDER — DENOSUMAB 60 MG/ML ~~LOC~~ SOSY: 60.0000 mg | PREFILLED_SYRINGE | Freq: Once | SUBCUTANEOUS | Status: DC

## 2021-06-23 MED ORDER — DIPHENHYDRAMINE HCL 50 MG/ML IJ SOLN: 25.0000 mg | Freq: Once | INTRAMUSCULAR | Status: DC | PRN

## 2021-06-23 MED ORDER — SODIUM CHLORIDE 0.9 % IV SOLN
Freq: Once | INTRAVENOUS | Status: DC | PRN
  Filled 2021-06-23: qty 250

## 2021-06-23 NOTE — Patient Instructions (Signed)

## 2021-06-29 ENCOUNTER — Inpatient Hospital Stay: Payer: Medicaid Other | Admitting: Nurse Practitioner

## 2021-06-29 ENCOUNTER — Telehealth: Payer: Self-pay | Admitting: Nurse Practitioner

## 2021-06-29 ENCOUNTER — Other Ambulatory Visit: Payer: Self-pay

## 2021-06-29 NOTE — Progress Notes (Deleted)
[facility]   [phone] [phone]   Clinic Follow up Note   Patient Care Team: Horald Pollen, MD as PCP - General (Internal Medicine) Truitt Merle, MD as PCP - Hematology/Oncology (Hematology) Mansouraty, Telford Nab., MD as Consulting Physician (Gastroenterology)  Date of Service:  06/29/2021  CHIEF COMPLAINT: nausea, and diarrhea   SUMMARY OF ONCOLOGIC HISTORY: Oncology History Overview Note  Cancer Staging Rectal cancer [facility]) Staging form: Colon and Rectum, AJCC 8th Edition - Clinical stage from 05/11/2017: cT3, cN2, cM0 - Signed by Truitt Merle, MD on 08/25/2019     Rectal cancer ([place])  05/11/2017 Cancer Staging   Staging form: Colon and Rectum, AJCC 8th Edition - Clinical stage from 05/11/2017: cT3, cN2, cM0 - Signed by Truitt Merle, MD on 08/25/2019    05/11/2017 Procedure   Colonoscopy by Dr Crissie Reese at [facility] -Palpable rectal mass found on digital rectal exam  -Likely malignant tumor in the rectum. Biopsied.  -One 6 mm polyp in the sigmoid colon, removed with a cold snare, resected and retrieved.  -Diverticulosis at the hepatic flexure and in the ascending colon -the examined portion of the ileum was normal.    09/10/2017 Initial Biopsy   Final Diagnosis  Sigmoid Polyp biopsies:  Tubular Adenoma  2. Rectal Mass biopsies:  Moderately differentiated adenocarcinoma, invasive.  The depth of the invasion cannot be assessed in this biopsy specimen.     - 06/29/2017 Chemotherapy   She received neoadjuvant infusional 5FU with concurrent radiation.     09/17/2017 Procedure   Sigmoidoscopy by Dr. Tami Ribas at [facility] on 09/17/17  was noted to have complete response from chemoRT   10/31/2017 - 01/2018 Chemotherapy   Consolidation Chemo 5FU and Leucovorin. Oxaliplatin was deferred due to pre-existing neuropathy. Pt declined rectal surgery.    01/13/2019 Imaging   CT CAP W Contrast at [facility]  show persistent and stable 41m nodule in the right middle lobe and stable 246mnodule in the superior segment of the left lower lobe. No new or suspect nodules   Cholelithiasis without evidence of cholecystitis.   Mild compression of the superior plate of L5, Stable   Overall no evidence of disease recurrence in the chest, abdomen or pelvis.    08/25/2019 Initial Diagnosis   [place]al cancer ([facility]   09/08/2019 Imaging   CT CAP W contrast 09/08/19  IMPRESSION: 1. Areas of mild rectal wall thickening, likely radiation change. No discrete rectal mass is identified. No perirectal or pelvic or retroperitoneal lymphadenopathy. 2. No findings suspicious for metastatic disease involving the liver or lungs. 3. Cholelithiasis.   10/07/2019 Procedure   Colonoscopy by Dr. MaSilvestre Moment1/17/20 IMPRESSION - Hemorrhoids found on digital rectal exam. - The examined portion of the ileum was normal. - One 4 mm polyp in the ascending colon, removed with a cold snare. Resected and retrieved. - A single colonic angioectasia. - Scar in the distal rectum. - Diverticulosis in the recto-sigmoid colon, in the sigmoid colon, in the descending colon and at the hepatic flexure. - Normal mucosa in the entire examined colon otherwise. - Non-bleeding non-thrombosed external and internal hemorrhoids. Diagnosis Surgical [P], colon, ascending, polyp - BENIGN COLONIC MUCOSA WITH LYMPHOID AGGREGATE. - NO DYSPLASIA OR MALIGNANCY.   06/03/2020 Imaging   MRI Pelvis  IMPRESSION: No evidence of residual/recurrent or metastatic disease within the pelvis.   10/27/2020 Procedure   Colonoscopy by Dr MaRush LandmarkIMPRESSION - Hemorrhoids found on digital rectal exam. -  Two 2 to 3 mm polyps in the ascending colon and in the cecum. - Normal mucosa in the entire examined colon. - Diverticulosis in the recto-sigmoid colon and in the sigmoid colon. - Non-bleeding non-thrombosed external and internal  hemorrhoids.     Diagnosis Surgical [P], colon, ascending, cecum, polyps (2) - TUBULAR ADENOMA (1 FRAGMENTS) - MULTIPLE FRAGMENTS OF BENIGN COLONIC MUCOSA - NO HIGH GRADE DYSPLASIA OR MALIGNANCY IDENTIFIED   02/03/2021 Imaging   CT CAP  IMPRESSION: 1. Post treatment change in the rectal wall without overt mass like lesion. 2. No evidence of metastatic disease within the chest abdomen or pelvis. 3. Hepatic steatosis. 4. Cholelithiasis. 5. Aortic atherosclerosis.   Aortic Atherosclerosis (ICD10-I70.0).       CURRENT THERAPY:  Surveillance  INTERVAL HISTORY:  [name] is here for an urgent visit and IV fluids. She was last seen by me on 02/07/21 and by NP Lacie in the interim. She was evaluated in the infusion area. She was seen by Lacie on 06/07/21 for diarrhea. Several days later, she developed vomiting and presented to the ED on 06/17/21. She reports the diarrhea got so bad that she had liquid stool every time she went to the bathroom to urinate. Because of this, she has become dehydrated. She notes she is passing gas but has been constipated since her urgent care visit. She reports she is not good about drinking water. She also reports dizziness for the last week.  About 2-3 days ago, she had whole body pain that led her to take a tramadol. The pain resolved with tramadol and has not recurred yet. She also reports she has been having a lot of cramps. She notes her diet has had to change since she had work done on her teeth. She reports a burning sensation, pointing to the area below her diaphragm.   All other systems were reviewed with the patient and are negative.  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  ALLERGIES:  is allergic to morphine and related and other.  MEDICATIONS:  Current Outpatient Medications  Medication Sig Dispense Refill   acetaminophen (TYLENOL) 500 MG tablet Take 500 mg by mouth every 6 (six) hours as needed for moderate pain or mild pain.     atorvastatin (LIPITOR) 20 MG tablet Take 1 tablet (20 mg total) by mouth daily. (Patient not taking: Reported on 06/20/2021) 90 tablet 3   calcium carbonate (OS-CAL) 600 MG TABS tablet Take 600 mg by mouth daily with breakfast.     ondansetron (ZOFRAN ODT) 4  MG disintegrating tablet Take 1 tablet (4 mg total) by mouth every 8 (eight) hours as needed for nausea or vomiting. 20 tablet 0   polycarbophil (FIBERCON) 625 MG tablet Take 625 mg by mouth daily.     No current facility-administered medications for this visit.    PHYSICAL EXAMINATION: ECOG PERFORMANCE STATUS: 2 - Symptomatic, <50% confined to bed  There were no vitals filed for this visit. Wt Readings from Last 3 Encounters:  06/07/21 146 lb 9.6 oz (66.5 kg)  03/30/21 144 lb 3.2 oz (65.4 kg)  02/18/21 144 lb 3.2 oz (65.4 kg)     GENERAL:alert, no distress and comfortable SKIN: skin color, texture, turgor are normal, no rashes or significant lesions EYES: normal, Conjunctiva are pink and non-injected, sclera clear  LUNGS: clear to auscultation and percussion with normal breathing effort HEART: regular rate & rhythm and no murmurs and no lower  extremity edema ABDOMEN:abdomen soft, non-tender and normal bowel sounds Musculoskeletal:no cyanosis of digits and no clubbing  NEURO: alert & oriented x 3 with fluent speech, no focal motor/sensory deficits  LABORATORY DATA:  I have reviewed the data as listed CBC Latest Ref Rng & Units 06/17/2021 06/07/2021 04/07/2021  WBC 4.0 - 10.5 K/uL 3.8(L) 4.5 3.9(L)  Hemoglobin 12.0 - 15.0 g/dL 13.4 12.4 12.6  Hematocrit 36.0 - 46.0 % 39.8 37.1 36.8  Platelets 150 - 400 K/uL 183 238 249.0     CMP Latest Ref Rng & Units 06/17/2021 06/07/2021 04/07/2021  Glucose 70 - 99 mg/dL 102(H) 110(H) 92  BUN 8 - 23 mg/dL '8 12 11  '$ Creatinine 0.44 - 1.00 mg/dL 0.94 0.73 0.67  Sodium 135 - 145 mmol/L 138 142 139  Potassium 3.5 - 5.1 mmol/L 4.2 4.0 4.8  Chloride 98 - 111 mmol/L 104 106 105  CO2 22 - 32 mmol/L '25 29 28  '$ Calcium 8.9 - 10.3 mg/dL 9.9 10.2 9.8  Total Protein 6.5 - 8.1 g/dL 7.0 7.1 -  Total Bilirubin 0.3 - 1.2 mg/dL 0.3 0.7 -  Alkaline Phos 38 - 126 U/L 35(L) 37(L) -  AST 15 - 41 U/L '23 17 12  '$ ALT 0 - 44 U/L '18 15 8      '$ RADIOGRAPHIC STUDIES: I have personally reviewed the radiological images as listed and agreed with the findings in the report. No results found.   ASSESSMENT & PLAN:  [name] is a 75 y.o. female with   1. Diarrhea, nausea with vomiting, dehydration, body pain -she reports onset after her diet changed following dental work and starting statin. She started having loose bowel movements and saw NP Lacie on 06/07/21. This worsened within a week to constant diarrhea.  -She also developed nausea with vomiting, which led to dehydration and dizziness. She presented to urgent care on 06/17/21. Lab was unremarkable      2.  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  She declined surgery.  On surveillance -Colonoscopy 12/21 showed hemorrhoid, diverticulosis, and 2 benign polyps.  Plan to  repeat 10/2021 -Surveillance CT 01/2021 showed no evidence of recurrent or metastatic disease -Port in place, continue flushes every 2 months -next routine surveillance f/up in 07/2021   3.Osteoporosis, chronic back pain -On Prolia, held due to extensive dental work.  Dentures were delivered but do not fit, has had to alter her diet -Chronic right back pain is stable, flares prior to BM then resolves -Mild TTP to right low/mid back, likely MSK  related -Pain managed with lidocaine patches and tramadol for severe pain    PLAN:     No problem-specific Assessment & Plan notes found for this encounter.   No orders of the defined types were placed in this encounter.  All questions were answered. The patient knows to call the clinic with any problems, questions or concerns. No barriers to learning was detected. The total time spent in the appointment was 25 minutes.     Truitt Merle, MD 06/29/2021

## 2021-06-29 NOTE — Telephone Encounter (Signed)
[name] came to cancer center this morning for follow up, had not been informed that this was a phone visit so she left without being seen. I called and spoke with her and apologized for the inconvenience and miscommunication.   Since I saw her 06/07/21 she has been holding atorvastatin. She went to ED 06/17/21 for n/v/d and was seen by Dr. Burr Medico 06/20/21 during IV fluids. Since last week her n/v/d have resolved. She denies abdominal pain or any issues. She feels much better. She is considering restarting atorvastatin.   Since statin may have contributed to GI upset, I will cc her PCP to consider alternative agent. Her next f/up with Dr. Burr Medico is 08/10/21. She knows to call sooner with any new/worsening concerns and appreciated the call.   Sherry Rue, NP

## 2021-07-14 ENCOUNTER — Telehealth: Payer: Self-pay | Admitting: Emergency Medicine

## 2021-07-14 NOTE — Telephone Encounter (Signed)
Patient says she has concerns about vaccine [id number] (recommended by immigration services)  Wants to speak w/ provider before getting vaccine  Please callback as soon as possible [phone]

## 2021-07-18 NOTE — Telephone Encounter (Signed)
Called and spoke with pt, rescheduled pt appointment.

## 2021-07-18 NOTE — Telephone Encounter (Signed)
Patient calling back (see previous message below)  Would like to have new patient moved up to anytime in sept if possible bc immigration form has to be filled out before October

## 2021-07-28 ENCOUNTER — Encounter: Payer: Self-pay | Admitting: Emergency Medicine

## 2021-07-28 ENCOUNTER — Other Ambulatory Visit: Payer: Self-pay

## 2021-07-28 ENCOUNTER — Ambulatory Visit (INDEPENDENT_AMBULATORY_CARE_PROVIDER_SITE_OTHER): Payer: Medicaid Other | Admitting: Emergency Medicine

## 2021-07-28 VITALS — BP 136/70 | HR 74 | Ht 63.0 in | Wt 142.0 lb

## 2021-07-28 DIAGNOSIS — Z7689 Persons encountering health services in other specified circumstances: Secondary | ICD-10-CM

## 2021-07-28 DIAGNOSIS — Z23 Encounter for immunization: Secondary | ICD-10-CM

## 2021-07-28 DIAGNOSIS — M810 Age-related osteoporosis without current pathological fracture: Secondary | ICD-10-CM | POA: Diagnosis not present

## 2021-07-28 DIAGNOSIS — C20 Malignant neoplasm of rectum: Secondary | ICD-10-CM

## 2021-07-28 DIAGNOSIS — Z95828 Presence of other vascular implants and grafts: Secondary | ICD-10-CM

## 2021-07-28 NOTE — Patient Instructions (Signed)
Mantenimiento de la salud despus de los 65 aos de edad Health Maintenance After Age 75 Despus de los 65 aos de edad, corre un riesgo mayor de padecer ciertas enfermedades e infecciones a largo plazo, como tambin de sufrir lesiones por cadas. Las cadas son la causa principal de las fracturas de huesos y lesiones en la cabeza de personas mayores de 65 aos de edad. Recibir cuidados preventivos de forma regular puede ayudarlo a mantenerse saludable y en buen estado. Los cuidados preventivos incluyen realizarse anlisis de forma regular y realizar cambios en el estilo de vida segn las recomendaciones del mdico. Converse con el mdico sobre lo siguiente: Las pruebas de deteccin y los anlisis que debe realizarse. Una prueba de deteccin es un estudio que se para detectar la presencia de una enfermedad cuando no tiene sntomas. Un plan de dieta y ejercicios adecuado para usted. Qu debo saber sobre las pruebas de deteccin y los anlisis para prevenir cadas? Realizarse pruebas de deteccin y anlisis es la mejor manera de detectar un problema de salud de forma temprana. El diagnstico y tratamiento tempranos le brindan la mejor oportunidad de controlar las afecciones mdicas que son comunes despus de los 65 aos de edad. Ciertas afecciones y elecciones de estilo de vida pueden hacer que sea ms propenso a sufrir una cada. El mdico puede recomendarle lo siguiente: Controles regulares de la visin. Una visin deficiente y afecciones como las cataratas pueden hacer que sea ms propenso a sufrir una cada. Si usa lentes, asegrese de obtener una receta actualizada si su visin cambia. Revisin de medicamentos. Revise regularmente con el mdico todos los medicamentos que toma, incluidos los medicamentos de venta libre. Consulte al mdico sobre los efectos secundarios que pueden hacer que sea ms propenso a sufrir una cada. Informe al mdico si alguno de los medicamentos que toma lo hace sentir mareado o  somnoliento. Pruebas de deteccin para la osteoporosis. La osteoporosis es una afeccin que hace que los huesos se vuelvan ms frgiles. En consecuencia, los huesos pueden debilitarse y quebrarse ms fcilmente. Pruebas de deteccin para la presin arterial. Los cambios en la presin arterial y los medicamentos para controlar la presin arterial pueden hacerlo sentir mareado. Controles de fuerza y equilibrio. El mdico puede recomendar ciertos estudios para controlar su fuerza y equilibrio al estar de pie, al caminar o al cambiar de posicin. Examen de los pies. El dolor y el adormecimiento en los pies, como tambin no utilizar el calzado adecuado, pueden hacer que sea ms propenso a sufrir una cada. Prueba de deteccin de la depresin. Es ms probable que sufra una cada si tiene miedo a caerse, se siente mal emocionalmente o se siente incapaz de realizar actividades que sola hacer. Prueba de deteccin de consumo de alcohol. Beber demasiado alcohol puede afectar su equilibrio y puede hacer que sea ms propenso a sufrir una cada. Qu medidas puedo tomar para reducir mi riesgo de sufrir una cada? Instrucciones generales Hable con el mdico sobre sus riesgos de sufrir una cada. Infrmele al mdico si: Se cae. Asegrese de informarle a su mdico acerca de todas las cadas, incluso aquellas que parecen ser menores. Se siente mareado, somnoliento o que pierde el equilibrio. Use los medicamentos de venta libre y los recetados solamente como se lo haya indicado el mdico. Estos incluyen todos los suplementos. Siga una dieta sana y mantenga un peso saludable. Una dieta saludable incluye productos lcteos descremados, carnes bajas en contenido de grasa (magras), fibra de granos enteros, frijoles y muchas   frutas y verduras. Seguridad en el hogar Retire los objetos que puedan causar tropiezos tales como alfombras, cables u obstculos. Instale equipos de seguridad, como barras para sostn en los baos y  barandas de seguridad en las escaleras. Mantenga las habitaciones y los pasillos bien iluminados. Actividad  Siga un programa de ejercicio regular para mantenerse en forma. Esto lo ayudar a mantener el equilibrio. Consulte al mdico qu tipos de ejercicios son adecuados para usted. Si necesita un bastn o un andador, selo segn las recomendaciones del mdico. Utilice calzado con buen apoyo y suela antideslizante. Estilo de vida No beba alcohol si el mdico se lo prohbe. Si bebe alcohol, limite la cantidad que consume: De 0 a 1 medida por da para las mujeres. De 0 a 2 medidas por da para los hombres. Est atento a la cantidad de alcohol que hay en las bebidas que toma. En los [place], una medida equivale a una botella tpica de cerveza (12 oz/355 ml), medio vaso de vino (5 oz/148 ml) o un trago de bebidas alcohlicas de alta graduacin (1 oz/45 ml). No consuma ningn producto que contenga nicotina o tabaco, como cigarrillos y cigarrillos electrnicos. Si necesita ayuda para dejar de consumir, consulte al mdico. Resumen Tener un estilo de vida saludable y recibir cuidados preventivos pueden ayudar a promover la salud y el bienestar despus de los 65 aos de edad. Realizarse pruebas de deteccin y anlisis es la mejor manera de detectar un problema de salud de forma temprana y ayudarlo a evitar una cada. El diagnstico y tratamiento tempranos le brindan la mejor oportunidad de controlar las afecciones mdicas ms comunes en las personas mayores de 65 aos de edad. Las cadas son la causa principal de las fracturas de huesos y lesiones en la cabeza de personas mayores de 65 aos de edad. Tome precauciones para evitar una cada en su casa. Trabaje con el mdico para saber qu cambios que puede hacer para mejorar su salud y bienestar, y para prevenir las cadas. Esta informacin no tiene como fin reemplazar el consejo del mdico. Asegrese de hacerle al mdico cualquier pregunta que  tenga. Document Revised: 11/16/2020 Document Reviewed: 11/16/2020 Elsevier Patient Education  2022 [employer].  

## 2021-07-28 NOTE — Progress Notes (Signed)
[name] 75 y.o.   Chief Complaint  Patient presents with   Establish Care    Immigration forms, vaccines    HISTORY OF PRESENT ILLNESS: This is a 75 y.o. female first visit to this office here to establish care with me. Needs to get flu and Tdap vaccines for immigration purposes.  Does not like getting vaccines.  Refused COVID-vaccine in the past. Past medical history includes rectal cancer.  In remission.  Sees oncologist on a regular basis.  Was initially diagnosed as stage IIIb. 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 Also has history of osteoporosis. Originally from [place]. Healthy lifestyle.  HPI   Prior to Admission medications   Medication Sig Start Date End Date Taking? Authorizing Provider  acetaminophen (TYLENOL) 500 MG tablet Take 500 mg by mouth every 6 (six) hours as needed for moderate pain or mild pain.   Yes [provider]  atorvastatin (LIPITOR) 20 MG tablet Take 1 tablet (20 mg total) by mouth daily. 04/13/21  Yes Cirigliano, Mary K, DO  calcium carbonate (OS-CAL) 600 MG TABS tablet Take 600 mg by mouth daily with breakfast.   Yes [provider]  ondansetron (ZOFRAN ODT) 4 MG disintegrating tablet Take 1 tablet (4 mg total) by mouth every 8 (eight) hours as needed for nausea or vomiting. 06/17/21  Yes Volney American, PA-C  polycarbophil (FIBERCON) 625 MG tablet Take 625 mg by mouth daily.   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   Effusion of joint of right upper arm 11/29/2019   Rectal cancer ([place]) 08/25/2019    Past Medical History:  Diagnosis Date   Anxiety    Arthritis    Cataract     Colon cancer ([place])    History of kidney stones     in system   Kidney stones    Macular degeneration    Neuromuscular disorder ([place])    Osteoporosis    Rectal cancer ([place])    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[place])     Past Surgical History:  Procedure Laterality Date   ABDOMINAL HYSTERECTOMY     COLONOSCOPY  10/07/2019   EYE SURGERY Bilateral    cataract   SPINE SURGERY  2017   schwannoma of spinal cord   TONSILLECTOMY     TOOTH EXTRACTION N/A 11/26/2020   Procedure: DENTAL RESTORATION/EXTRACTIONS;  Surgeon: Diona Browner, DMD;  Location: [facility];  Service: Oral Surgery;  Laterality: N/A;    Social History   Socioeconomic History   Marital status: Significant Other    Spouse name: Not on file   Number of children: 1   Years of education: Not on file   Highest education level: Not on file  Occupational History   Not on file  Tobacco Use   Smoking status: Former    Packs/day: 2.00    Years: 26.00    Pack years: 52.00    Types: Cigarettes    Quit date: 11/21/1987    Years since quitting: 33.7   Smokeless tobacco: Never  Vaping Use   Vaping Use: Never used  Substance and Sexual Activity   Alcohol use: Yes    Alcohol/week:  1.0 standard drink    Types: 1 Glasses of wine per week    Comment: wine socially   Drug use: Yes    Types: Other-see comments    Comment: CBD   Sexual activity: Yes  Other Topics Concern   Not on file  Social History Narrative   Not on file   Social Determinants of Health   Financial Resource Strain: Not on file  Food Insecurity: Not on file  Transportation Needs: Not on file  Physical Activity: Not on file  Stress: Not on file  Social Connections: Not on file  Intimate Partner Violence: Not on file    Family History  Problem Relation Age of Onset   Cancer Mother        head and neck cancer    Cancer Sister        lung cancer    Cancer Brother        pancreatic cancer    Pancreatic cancer Brother    Cancer Paternal Grandmother         breast cancer    Colon cancer Neg Hx    Esophageal cancer Neg Hx    Rectal cancer Neg Hx    Stomach cancer Neg Hx    Inflammatory bowel disease Neg Hx    Liver disease Neg Hx      Review of Systems  Constitutional: Negative.  Negative for chills and fever.  HENT: Negative.  Negative for congestion and sore throat.   Respiratory: Negative.  Negative for cough and shortness of breath.   Cardiovascular:  Negative for chest pain and palpitations.  Gastrointestinal:  Negative for abdominal pain, diarrhea, nausea and vomiting.  Genitourinary: Negative.  Negative for dysuria and hematuria.  Musculoskeletal:  Positive for back pain. Negative for myalgias and neck pain.  Skin: Negative.  Negative for rash.  Neurological:  Negative for dizziness and headaches.  All other systems reviewed and are negative.  Vitals:   07/28/21 1014  BP: 136/70  Pulse: 74  SpO2: 95%    Physical Exam Vitals reviewed.  Constitutional:      Appearance: Normal appearance.  HENT:     Head: Normocephalic.  Eyes:     Extraocular Movements: Extraocular movements intact.     Pupils: Pupils are equal, round, and reactive to light.  Cardiovascular:     Rate and Rhythm: Normal rate and regular rhythm.     Pulses: Normal pulses.     Heart sounds: Normal heart sounds.  Pulmonary:     Effort: Pulmonary effort is normal.     Breath sounds: Normal breath sounds.  Musculoskeletal:     Cervical back: Normal range of motion and neck supple. No tenderness.     Right lower leg: No edema.     Left lower leg: No edema.  Lymphadenopathy:     Cervical: No cervical adenopathy.  Skin:    General: Skin is warm and dry.     Capillary Refill: Capillary refill takes less than 2 seconds.  Neurological:     General: No focal deficit present.     Mental Status: She is alert and oriented to person, place, and time.  Psychiatric:        Mood and Affect: Mood normal.        Behavior: Behavior normal.     ASSESSMENT &  PLAN: A total of 35 minutes was spent with the patient and counseling/coordination of care regarding preparing for this visit, establishing care with me, review of past  medical history, review of medical records available today, review of all medications, review of vaccination profile and requests from immigration office, review of most recent blood work results, prognosis, documentation, and need for follow-up.  Hadlea was seen today for establish care.  Diagnoses and all orders for this visit:  Rectal cancer ([place])  Encounter to establish care  Age-related osteoporosis without current pathological fracture  Port-A-Cath in place  Need for influenza vaccination -     Flu Vaccine QUAD High Dose(Fluad)  Need for Tdap vaccination -     Tdap vaccine greater than or equal to 7yo IM  Patient Instructions  Mantenimiento de la salud despus de los 20 aos de edad Health Maintenance After Age 9 Despus de los 65 aos de edad, corre un riesgo mayor de Tourist information centre manager enfermedades e infecciones a Barrister's clerk, como tambin de sufrir lesiones por cadas. Las cadas son la causa principal de las fracturas de huesos y lesiones en la cabeza de personas mayores de 84 aos de edad. Recibir cuidados preventivos de forma regular puede ayudarlo a mantenerse saludable y en buen [place]. Los cuidados preventivos incluyen realizarse anlisis de forma regular y Actor en el estilo de vida segn las recomendaciones del mdico. Converse con el mdico sobre lo siguiente: Las pruebas de deteccin y los anlisis que debe Dispensing optician. Una prueba de deteccin es un estudio que se para Hydrographic surveyor la presencia de una enfermedad cuando no tiene sntomas. Un plan de dieta y ejercicios adecuado para usted. Qu debo saber sobre las pruebas de deteccin y los anlisis para prevenir cadas? Realizarse pruebas de deteccin y [employer] es la mejor manera de Hydrographic surveyor un problema de salud de forma temprana. El diagnstico y  tratamiento tempranos le brindan la mejor oportunidad de Chief Technology Officer las afecciones mdicas que son comunes despus de los 48 aos de edad. Ciertas afecciones y elecciones de estilo de vida pueden hacer que sea ms propenso a sufrir Engineer, manufacturing. El mdico puede recomendarle lo siguiente: Controles regulares de la visin. Una visin deficiente y afecciones como las cataratas pueden hacer que sea ms propenso a sufrir Engineer, manufacturing. Si [place] lentes, asegrese de obtener una receta actualizada si su visin cambia. Revisin de medicamentos. Revise regularmente con el mdico todos los medicamentos que toma, incluidos los medicamentos de [place]. Consulte al [employer] efectos secundarios que pueden hacer que sea ms propenso a sufrir Engineer, manufacturing. Informe al mdico si alguno de los medicamentos que toma lo hace sentir mareado o somnoliento. Pruebas de deteccin para la osteoporosis. La osteoporosis es una afeccin que hace que los huesos se vuelvan ms frgiles. En consecuencia, los huesos pueden debilitarse y quebrarse ms fcilmente. Pruebas de deteccin para la presin arterial. Los cambios en la presin arterial y los medicamentos para Chief Technology Officer la presin arterial pueden hacerlo sentir mareado. Controles de fuerza y equilibrio. El mdico puede recomendar ciertos estudios para controlar su fuerza y equilibrio al estar de pie, al caminar o al cambiar de posicin. Examen de los pies. El dolor y Chiropractor en los pies, como tambin no utilizar el calzado [place], pueden hacer que sea ms propenso a sufrir Engineer, manufacturing. Prueba de deteccin de la depresin. Es ms probable que sufra una cada si tiene miedo a caerse, se siente mal emocionalmente o se siente incapaz de Patent examiner. Prueba de deteccin de consumo de alcohol. Beber demasiado alcohol puede afectar su equilibrio y puede hacer que sea ms propenso a sufrir Engineer, manufacturing. Sander Nephew  medidas puedo tomar para reducir mi riesgo de sufrir  una cada? Instrucciones generales Hable con el mdico sobre sus riesgos de sufrir una cada. Infrmele al mdico si: Se cae. Asegrese de informarle a su mdico acerca de todas las cadas, incluso aquellas que parecen ser [employer] Se siente mareado, somnoliento o que pierde el equilibrio. Use los medicamentos de venta libre y los recetados solamente como se lo haya indicado el mdico. Estos incluyen todos los suplementos. Siga una dieta sana y [place] un peso saludable. Una dieta saludable incluye productos lcteos descremados, carnes bajas en contenido de grasa ([place]), fibra de granos enteros, frijoles y [place] frutas y verduras. Seguridad en el hogar Retire los objetos que puedan causar tropiezos tales como alfombras, cables u obstculos. Instale equipos de seguridad, como barras para sostn en los baos y barandas de seguridad en las escaleras. [place] habitaciones y los pasillos bien iluminados. Actividad  Siga un programa de ejercicio regular para mantenerse en forma. Esto lo ayudar a Contractor equilibrio. Consulte al mdico qu tipos de ejercicios son adecuados para usted. Si necesita un bastn o un andador, selo segn las recomendaciones del mdico. Utilice calzado con buen apoyo y suela antideslizante. Estilo de vida No beba alcohol si el mdico se lo prohbe. Si bebe alcohol, limite la cantidad que consume: De 0 a 1 medida por da para las mujeres. De 0 a 2 medidas por da para los hombres. Est atento a la cantidad de alcohol que hay en las bebidas que toma. En los [place], una medida equivale a una botella tpica de cerveza (12 oz/355 ml), medio vaso de vino (5 oz/148 ml) o un trago de bebidas alcohlicas de alta graduacin (1 oz/45 ml). No consuma ningn producto que contenga nicotina o tabaco, como cigarrillos y Psychologist, sport and exercise. Si necesita ayuda para dejar de consumir, consulte al [employer]. Resumen Tener un estilo de vida saludable y recibir cuidados  preventivos pueden ayudar a Theatre stage manager salud y el bienestar despus de los 61 aos de [place]. Realizarse pruebas de deteccin y [employer] es la mejor manera de Hydrographic surveyor un problema de salud de forma temprana y Lourena Simmonds a Product/process development scientist una cada. El diagnstico y tratamiento tempranos le brindan la mejor oportunidad de Chief Technology Officer las afecciones mdicas ms comunes en las personas mayores de 67 aos de edad. Las cadas son la causa principal de las fracturas de huesos y lesiones en la cabeza de personas mayores de 4 aos de edad. Tome precauciones para evitar una cada en su casa. Trabaje con el mdico para saber qu cambios que puede hacer para mejorar su salud y [place], y [place]. Esta informacin no tiene Marine scientist el consejo del mdico. Asegrese de hacerle al mdico cualquier pregunta que tenga. Document Revised: 11/16/2020 Document Reviewed: 11/16/2020 Elsevier Patient Education  2022 Driscoll, MD [facility]

## 2021-08-09 NOTE — Progress Notes (Signed)
[facility]   [phone] [phone]   Clinic Follow up Note   Patient Care Team: Horald Pollen, MD as PCP - General (Internal Medicine) Truitt Merle, MD as PCP - Hematology/Oncology (Hematology) Mansouraty, Telford Nab., MD as Consulting Physician (Gastroenterology)  Date of Service:  08/10/2021  CHIEF COMPLAINT: f/u of rectal cancer  CURRENT THERAPY:  Surveillance  ASSESSMENT & PLAN:  [name] is a 75 y.o. female with   1.  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  She declined surgery.  On surveillance -Colonoscopy 12/21 showed hemorrhoid, diverticulosis, and 2 benign polyps.  Plan to repeat 10/2021 -Surveillance CT 01/2021 showed no evidence of recurrent or metastatic disease -Port in place, continue flushes every 4 weeks (due to prior issues) she strongly prefers to keep her port -She had a few months of nausea, diarrhea, body aches and dehydration in the summer 2022, she has recovered well, with mild residual loose bowel movement twice a day. -She is due for colonoscopy in late Nov, I will send a message to Dr. Rush Landmark   2.Osteoporosis, chronic back pain -On Prolia, held due to extensive dental work.  Dentures were delivered but do not fit, has had to alter her diet -Chronic right back pain is stable, flares prior to BM then resolves -Mild TTP to right low/mid back, likely MSK related -Pain managed with lidocaine patches and tramadol for severe pain   3.  Internal and external hemorrhoid -Follow-up with Dr. Rush Landmark   PLAN: - she is clinically doing well, continue surveillance -Port flush every month -f/u in 4 months with lab and CT scan before  -I sent a message to Dr. Rush Landmark for her colonoscopy in late November, and her chemoradiation.   No problem-specific Assessment & Plan notes found for this encounter.   SUMMARY OF  ONCOLOGIC HISTORY: Oncology History Overview Note  Cancer Staging Rectal cancer [facility]) Staging form: Colon and Rectum, AJCC 8th Edition - Clinical stage from 05/11/2017: cT3, cN2, cM0 - Signed by Truitt Merle, MD on 08/25/2019    Rectal cancer ([place])  05/11/2017 Cancer Staging   Staging form: Colon and Rectum, AJCC 8th Edition - Clinical stage from 05/11/2017: cT3, cN2, cM0 - Signed by Truitt Merle, MD on 08/25/2019   05/11/2017 Procedure   Colonoscopy by Dr Crissie Reese at [facility] -Palpable rectal mass found on digital rectal exam  -Likely malignant tumor in the rectum. Biopsied.  -One 6 mm polyp in the sigmoid colon, removed with a cold snare, resected and retrieved.  -Diverticulosis at the hepatic flexure and in the ascending colon -the examined portion of the ileum was normal.    09/10/2017 Initial Biopsy   Final Diagnosis  Sigmoid Polyp biopsies:  Tubular Adenoma  2. Rectal Mass biopsies:  Moderately differentiated adenocarcinoma, invasive.  The depth of the invasion cannot be assessed in this biopsy specimen.     - 06/29/2017 Chemotherapy   She received neoadjuvant infusional 5FU with concurrent radiation.     09/17/2017 Procedure   Sigmoidoscopy by Dr. Tami Ribas at [facility] on 09/17/17  was noted to have complete response from chemoRT   10/31/2017 - 01/2018 Chemotherapy   Consolidation Chemo 5FU and Leucovorin. Oxaliplatin was deferred due to pre-existing neuropathy. Pt declined rectal surgery.    01/13/2019 Imaging   CT CAP W Contrast at [place] show persistent and stable 74mm nodule in the right middle lobe and stable  52mm nodule in the superior segment of the left lower lobe. No new or suspect nodules   Cholelithiasis without evidence of cholecystitis.   Mild compression of the superior plate of L5, Stable   Overall no evidence of disease recurrence in the chest, abdomen or pelvis.    08/25/2019 Initial Diagnosis   Rectal cancer ([place])    09/08/2019 Imaging   CT CAP W contrast 09/08/19  IMPRESSION: 1. Areas of mild rectal wall thickening, likely radiation change. No discrete rectal mass is identified. No perirectal or pelvic or retroperitoneal lymphadenopathy. 2. No findings suspicious for metastatic disease involving the liver or lungs. 3. Cholelithiasis.   10/07/2019 Procedure   Colonoscopy by Dr. Silvestre Moment 10/07/19 IMPRESSION - Hemorrhoids found on digital rectal exam. - The examined portion of the ileum was normal. - One 4 mm polyp in the ascending colon, removed with a cold snare. Resected and retrieved. - A single colonic angioectasia. - Scar in the distal rectum. - Diverticulosis in the recto-sigmoid colon, in the sigmoid colon, in the descending colon and at the hepatic flexure. - Normal mucosa in the entire examined colon otherwise. - Non-bleeding non-thrombosed external and internal hemorrhoids. Diagnosis Surgical [P], colon, ascending, polyp - BENIGN COLONIC MUCOSA WITH LYMPHOID AGGREGATE. - NO DYSPLASIA OR MALIGNANCY.   06/03/2020 Imaging   MRI Pelvis  IMPRESSION: No evidence of residual/recurrent or metastatic disease within the pelvis.   10/27/2020 Procedure   Colonoscopy by Dr Rush Landmark  IMPRESSION - Hemorrhoids found on digital rectal exam. - Two 2 to 3 mm polyps in the ascending colon and in the cecum. - Normal mucosa in the entire examined colon. - Diverticulosis in the recto-sigmoid colon and in the sigmoid colon. - Non-bleeding non-thrombosed external and internal hemorrhoids.     Diagnosis Surgical [P], colon, ascending, cecum, polyps (2) - TUBULAR ADENOMA (1 FRAGMENTS) - MULTIPLE FRAGMENTS OF BENIGN COLONIC MUCOSA - NO HIGH GRADE DYSPLASIA OR MALIGNANCY IDENTIFIED   02/03/2021 Imaging   CT CAP  IMPRESSION: 1. Post treatment change in the rectal wall without overt mass like lesion. 2. No evidence of metastatic disease within the chest abdomen or pelvis. 3. Hepatic  steatosis. 4. Cholelithiasis. 5. Aortic atherosclerosis.   Aortic Atherosclerosis (ICD10-I70.0).       INTERVAL HISTORY:  [name] is here for a follow up of rectal cancer. She was last seen by me on 06/20/21. She presents to the clinic alone. She is doing better, no recurrent nausea, but still has loose BM, 3-4 times a day. Had multiple vac    All other systems were reviewed with the patient and are negative.  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  ALLERGIES:  is allergic to morphine and related and other.  MEDICATIONS:  Current Outpatient Medications  Medication Sig Dispense Refill   acetaminophen (TYLENOL) 500 MG tablet Take 500 mg by mouth every 6 (six) hours as needed for moderate pain or mild pain.     atorvastatin (LIPITOR) 20 MG tablet Take 1 tablet (20 mg total) by mouth daily. 90 tablet 3   calcium carbonate (OS-CAL) 600 MG TABS tablet Take 600 mg by mouth daily with breakfast.     ondansetron (ZOFRAN ODT) 4 MG disintegrating tablet Take 1 tablet (4 mg total) by mouth every 8 (eight) hours as needed for nausea or vomiting. 20 tablet 0   polycarbophil (FIBERCON) 625 MG tablet  Take 625 mg by mouth daily.     No current facility-administered medications for this visit.    PHYSICAL EXAMINATION: ECOG PERFORMANCE STATUS: 1 - Symptomatic but completely ambulatory  Vitals:   08/10/21 1034  BP: (!) 148/65  Pulse: (!) 59  Resp: 18  Temp: 98 F (36.7 C)  SpO2: 98%   Wt Readings from Last 3 Encounters:  08/10/21 143 lb 6.4 oz (65 kg)  07/28/21 142 lb (64.4 kg)  06/07/21 146 lb 9.6 oz (66.5 kg)     GENERAL:alert, no distress and comfortable SKIN: skin color, texture, turgor are normal, no rashes or significant lesions EYES: normal, Conjunctiva are pink and non-injected, sclera clear NECK: supple, thyroid normal size, non-tender, without nodularity LYMPH:  no palpable lymphadenopathy in the cervical, axillary  LUNGS: clear to auscultation and percussion with normal breathing effort HEART: regular rate & rhythm and no murmurs and no lower extremity edema ABDOMEN:abdomen soft, non-tender and normal bowel sounds Musculoskeletal:no cyanosis of digits and no clubbing  NEURO: alert & oriented x 3 with fluent speech, no focal motor/sensory deficits RECTAL EXAM: (+) External hemorrhoids without bleeding, no palpable mass in the rectum, no blood on glove  LABORATORY DATA:  I have reviewed the data as listed CBC Latest Ref Rng & Units 06/17/2021 06/07/2021 04/07/2021  WBC 4.0 - 10.5 K/uL 3.8(L) 4.5 3.9(L)  Hemoglobin 12.0 - 15.0 g/dL 13.4 12.4 12.6  Hematocrit 36.0 - 46.0 % 39.8 37.1 36.8  Platelets 150 - 400 K/uL 183 238 249.0     CMP Latest Ref Rng & Units 06/17/2021 06/07/2021 04/07/2021  Glucose 70 - 99 mg/dL 102(H) 110(H) 92  BUN 8 - 23 mg/dL 8 12 11   Creatinine 0.44 - 1.00 mg/dL 0.94 0.73 0.67  Sodium 135 - 145 mmol/L 138 142 139  Potassium 3.5 - 5.1 mmol/L 4.2 4.0 4.8  Chloride 98 - 111 mmol/L 104 106 105  CO2 22 - 32 mmol/L 25 29 28   Calcium 8.9 - 10.3 mg/dL 9.9 10.2 9.8  Total Protein 6.5 - 8.1 g/dL 7.0 7.1 -  Total Bilirubin 0.3 - 1.2 mg/dL 0.3 0.7 -   Alkaline Phos 38 - 126 U/L 35(L) 37(L) -  AST 15 - 41 U/L 23 17 12   ALT 0 - 44 U/L 18 15 8       RADIOGRAPHIC STUDIES: I have personally reviewed the radiological images as listed and agreed with the findings in the report. No results found.    No orders of the defined types were placed in this encounter.  All questions were answered. The patient knows to call the clinic with any problems, questions or concerns. No barriers to learning was  detected. The total time spent in the appointment was 30 minutes.     Truitt Merle, MD 08/10/2021   I, Wilburn Mylar, am acting as scribe for Truitt Merle, MD.   I have reviewed the above documentation for accuracy and completeness, and I agree with the above.

## 2021-08-10 ENCOUNTER — Other Ambulatory Visit: Payer: Medicaid Other

## 2021-08-10 ENCOUNTER — Encounter: Payer: Self-pay | Admitting: Hematology

## 2021-08-10 ENCOUNTER — Inpatient Hospital Stay: Payer: Medicaid Other | Admitting: Hematology

## 2021-08-10 ENCOUNTER — Other Ambulatory Visit: Payer: Self-pay

## 2021-08-10 ENCOUNTER — Inpatient Hospital Stay: Payer: Medicaid Other | Attending: Hematology

## 2021-08-10 ENCOUNTER — Inpatient Hospital Stay: Payer: Medicaid Other

## 2021-08-10 VITALS — BP 148/65 | HR 59 | Temp 98.0°F | Resp 18 | Ht 63.0 in | Wt 143.4 lb

## 2021-08-10 DIAGNOSIS — M810 Age-related osteoporosis without current pathological fracture: Secondary | ICD-10-CM | POA: Diagnosis not present

## 2021-08-10 DIAGNOSIS — K644 Residual hemorrhoidal skin tags: Secondary | ICD-10-CM | POA: Diagnosis not present

## 2021-08-10 DIAGNOSIS — K648 Other hemorrhoids: Secondary | ICD-10-CM | POA: Insufficient documentation

## 2021-08-10 DIAGNOSIS — E559 Vitamin D deficiency, unspecified: Secondary | ICD-10-CM

## 2021-08-10 DIAGNOSIS — Z9221 Personal history of antineoplastic chemotherapy: Secondary | ICD-10-CM | POA: Insufficient documentation

## 2021-08-10 DIAGNOSIS — Z95828 Presence of other vascular implants and grafts: Secondary | ICD-10-CM

## 2021-08-10 DIAGNOSIS — C20 Malignant neoplasm of rectum: Secondary | ICD-10-CM | POA: Insufficient documentation

## 2021-08-10 LAB — CMP (CANCER CENTER ONLY)
ALT: 8 U/L (ref 0–44)
AST: 14 U/L — ABNORMAL LOW (ref 15–41)
Albumin: 4 g/dL (ref 3.5–5.0)
Alkaline Phosphatase: 38 U/L (ref 38–126)
Anion gap: 8 (ref 5–15)
BUN: 14 mg/dL (ref 8–23)
CO2: 25 mmol/L (ref 22–32)
Calcium: 10.1 mg/dL (ref 8.9–10.3)
Chloride: 108 mmol/L (ref 98–111)
Creatinine: 0.76 mg/dL (ref 0.44–1.00)
GFR, Estimated: >60 mL/min (ref >60)
Glucose, Bld: 98 mg/dL (ref 70–99)
Potassium: 4 mmol/L (ref 3.5–5.1)
Sodium: 141 mmol/L (ref 135–145)
Total Bilirubin: 0.6 mg/dL (ref 0.3–1.2)
Total Protein: 6.8 g/dL (ref 6.5–8.1)

## 2021-08-10 LAB — CBC WITH DIFFERENTIAL (CANCER CENTER ONLY)
Abs Immature Granulocytes: 0.01 10*3/uL (ref 0.00–0.07)
Basophils Absolute: 0.1 10*3/uL (ref 0.0–0.1)
Basophils Relative: 1 %
Eosinophils Absolute: 0.1 10*3/uL (ref 0.0–0.5)
Eosinophils Relative: 3 %
HCT: 36.3 % (ref 36.0–46.0)
Hemoglobin: 12 g/dL (ref 12.0–15.0)
Immature Granulocytes: 0 %
Lymphocytes Relative: 26 %
Lymphs Abs: 1.4 10*3/uL (ref 0.7–4.0)
MCH: 28.6 pg (ref 26.0–34.0)
MCHC: 33.1 g/dL (ref 30.0–36.0)
MCV: 86.4 fL (ref 80.0–100.0)
Monocytes Absolute: 0.4 10*3/uL (ref 0.1–1.0)
Monocytes Relative: 8 %
Neutro Abs: 3.3 10*3/uL (ref 1.7–7.7)
Neutrophils Relative %: 62 %
Platelet Count: 324 10*3/uL (ref 150–400)
RBC: 4.2 MIL/uL (ref 3.87–5.11)
RDW: 13.4 % (ref 11.5–15.5)
WBC Count: 5.3 10*3/uL (ref 4.0–10.5)
nRBC: 0 % (ref 0.0–0.2)

## 2021-08-10 LAB — CEA (IN HOUSE-CHCC): CEA (CHCC-In House): <1 ng/mL (ref 0.00–5.00)

## 2021-08-10 LAB — VITAMIN D 25 HYDROXY (VIT D DEFICIENCY, FRACTURES): Vit D, 25-Hydroxy: 47 ng/mL (ref 30–100)

## 2021-08-10 MED ORDER — SODIUM CHLORIDE 0.9% FLUSH
10.0000 mL | Freq: Once | INTRAVENOUS | Status: AC
  Administered 2021-08-10: 10 mL

## 2021-08-10 MED ORDER — HEPARIN SOD (PORK) LOCK FLUSH 100 UNIT/ML IV SOLN
500.0000 [IU] | Freq: Once | INTRAVENOUS | Status: AC
Start: 2021-08-10 — End: 2021-08-10
  Administered 2021-08-10: 500 [IU]

## 2021-08-10 MED ORDER — LIDOCAINE-PRILOCAINE 2.5-2.5 % EX CREA: 1.0000 "application " | TOPICAL_CREAM | CUTANEOUS | 1 refills | Status: DC | PRN

## 2021-08-11 ENCOUNTER — Telehealth: Payer: Self-pay | Admitting: Hematology

## 2021-08-11 NOTE — Telephone Encounter (Signed)
Scheduled follow-up appointments per 9/21 los. Patient is aware.

## 2021-08-15 ENCOUNTER — Telehealth: Payer: Self-pay | Admitting: Emergency Medicine

## 2021-08-15 DIAGNOSIS — Z7689 Persons encountering health services in other specified circumstances: Secondary | ICD-10-CM

## 2021-08-15 NOTE — Telephone Encounter (Signed)
Please place referral. Thanks!

## 2021-08-15 NOTE — Telephone Encounter (Signed)
Patient is requesting referral be sent to eye specialist:  [facility] Dr. Tempie Hoist [phone]

## 2021-08-18 NOTE — Telephone Encounter (Signed)
Referral ordered

## 2021-08-24 ENCOUNTER — Encounter: Payer: Self-pay | Admitting: Gastroenterology

## 2021-08-24 ENCOUNTER — Telehealth: Payer: Self-pay

## 2021-08-24 NOTE — Telephone Encounter (Signed)
[name] can you please call the pt and set her up for [place] colon and previsit for November with Dr Rush Landmark?  Thank you

## 2021-08-24 NOTE — Telephone Encounter (Signed)
-----   Message from Timothy Lasso, RN sent at 08/10/2021 11:55 AM EDT -----  ----- Message ----- From: Irving Copas., MD Sent: 08/10/2021  11:46 AM EDT To: Timothy Lasso, RN, Truitt Merle, MD  YF, Thanks for the update. I will have my team work on getting her scheduled for her colonoscopy for colorectal cancer surveillance in November. Her hemorrhoids are combination of internal and external, so if nothing has significantly changed at the time of her next procedure, I would likely refer her to one of the colorectal surgeons to discuss therapy for both of these areas.  Jaydee Conran, please move forward with reaching out to the patient and get her scheduled for November colonoscopy.  This can be done in the [place]. Thanks. GM ----- Message ----- From: Truitt Merle, MD Sent: 08/10/2021  10:52 AM EDT To: Irving Copas., MD  Cordella Register,   She is due for colonoscopy in early Dec, she would like to have it in late Nov if possible.  She is also concerned about her hemorrhoids, she wants to know if you can offer banding or any other treatment for that  Thanks   Krista Blue

## 2021-08-25 ENCOUNTER — Ambulatory Visit: Payer: Medicaid Other | Admitting: Emergency Medicine

## 2021-08-29 ENCOUNTER — Telehealth: Payer: Self-pay | Admitting: Emergency Medicine

## 2021-08-29 NOTE — Telephone Encounter (Signed)
Pt requesting a referral to Dr. Zigmund Daniel ophthalmology  Appt made 09-06-2021 [place]

## 2021-08-29 NOTE — Telephone Encounter (Signed)
Called and spoke with patient, Dr. Zigmund Daniel is not accepting new patients with Medicaid. Pt will speak with her insurance and find out which eye specialists is covered.

## 2021-09-06 ENCOUNTER — Ambulatory Visit: Payer: Medicaid Other | Admitting: Emergency Medicine

## 2021-09-08 ENCOUNTER — Inpatient Hospital Stay: Payer: Medicaid Other | Attending: Hematology

## 2021-09-08 ENCOUNTER — Other Ambulatory Visit: Payer: Self-pay

## 2021-09-08 DIAGNOSIS — C20 Malignant neoplasm of rectum: Secondary | ICD-10-CM | POA: Insufficient documentation

## 2021-09-08 DIAGNOSIS — M810 Age-related osteoporosis without current pathological fracture: Secondary | ICD-10-CM

## 2021-09-08 DIAGNOSIS — Z452 Encounter for adjustment and management of vascular access device: Secondary | ICD-10-CM | POA: Insufficient documentation

## 2021-09-08 DIAGNOSIS — Z95828 Presence of other vascular implants and grafts: Secondary | ICD-10-CM

## 2021-09-08 MED ORDER — HEPARIN SOD (PORK) LOCK FLUSH 100 UNIT/ML IV SOLN
500.0000 [IU] | Freq: Once | INTRAVENOUS | Status: AC
  Administered 2021-09-08: 500 [IU]

## 2021-09-08 MED ORDER — SODIUM CHLORIDE 0.9% FLUSH
10.0000 mL | Freq: Once | INTRAVENOUS | Status: AC
  Administered 2021-09-08: 10 mL

## 2021-09-14 ENCOUNTER — Ambulatory Visit: Payer: Medicaid Other | Admitting: Emergency Medicine

## 2021-09-14 ENCOUNTER — Other Ambulatory Visit: Payer: Self-pay

## 2021-09-14 ENCOUNTER — Encounter: Payer: Self-pay | Admitting: Emergency Medicine

## 2021-09-14 ENCOUNTER — Ambulatory Visit (INDEPENDENT_AMBULATORY_CARE_PROVIDER_SITE_OTHER): Payer: Medicaid Other

## 2021-09-14 VITALS — BP 122/78 | HR 71 | Ht 63.0 in | Wt 143.0 lb

## 2021-09-14 DIAGNOSIS — R0781 Pleurodynia: Secondary | ICD-10-CM

## 2021-09-14 DIAGNOSIS — M25551 Pain in right hip: Secondary | ICD-10-CM

## 2021-09-14 DIAGNOSIS — M25562 Pain in left knee: Secondary | ICD-10-CM | POA: Diagnosis not present

## 2021-09-14 DIAGNOSIS — L659 Nonscarring hair loss, unspecified: Secondary | ICD-10-CM | POA: Insufficient documentation

## 2021-09-14 DIAGNOSIS — K921 Melena: Secondary | ICD-10-CM | POA: Diagnosis not present

## 2021-09-14 DIAGNOSIS — G8929 Other chronic pain: Secondary | ICD-10-CM | POA: Diagnosis not present

## 2021-09-14 DIAGNOSIS — M25561 Pain in right knee: Secondary | ICD-10-CM

## 2021-09-14 LAB — LIPID PANEL
Cholesterol: 179 mg/dL (ref 0–200)
HDL: 79.4 mg/dL (ref >39.00)
LDL Cholesterol: 86 mg/dL (ref 0–99)
NonHDL: 99.34
Total CHOL/HDL Ratio: 2
Triglycerides: 68 mg/dL (ref 0.0–149.0)
VLDL: 13.6 mg/dL (ref 0.0–40.0)

## 2021-09-14 LAB — CBC WITH DIFFERENTIAL/PLATELET
Basophils Absolute: 0 10*3/uL (ref 0.0–0.1)
Basophils Relative: 0.7 % (ref 0.0–3.0)
Eosinophils Absolute: 0.1 10*3/uL (ref 0.0–0.7)
Eosinophils Relative: 2.2 % (ref 0.0–5.0)
HCT: 39.4 % (ref 36.0–46.0)
Hemoglobin: 13 g/dL (ref 12.0–15.0)
Lymphocytes Relative: 27.9 % (ref 12.0–46.0)
Lymphs Abs: 1.4 10*3/uL (ref 0.7–4.0)
MCHC: 33 g/dL (ref 30.0–36.0)
MCV: 87 fl (ref 78.0–100.0)
Monocytes Absolute: 0.4 10*3/uL (ref 0.1–1.0)
Monocytes Relative: 8.7 % (ref 3.0–12.0)
Neutro Abs: 3 10*3/uL (ref 1.4–7.7)
Neutrophils Relative %: 60.5 % (ref 43.0–77.0)
Platelets: 260 10*3/uL (ref 150.0–400.0)
RBC: 4.53 Mil/uL (ref 3.87–5.11)
RDW: 14.7 % (ref 11.5–15.5)
WBC: 5 10*3/uL (ref 4.0–10.5)

## 2021-09-14 LAB — COMPREHENSIVE METABOLIC PANEL
ALT: 15 U/L (ref 0–35)
AST: 19 U/L (ref 0–37)
Albumin: 4.6 g/dL (ref 3.5–5.2)
Alkaline Phosphatase: 36 U/L — ABNORMAL LOW (ref 39–117)
BUN: 14 mg/dL (ref 6–23)
CO2: 29 mEq/L (ref 19–32)
Calcium: 11.2 mg/dL — ABNORMAL HIGH (ref 8.4–10.5)
Chloride: 106 mEq/L (ref 96–112)
Creatinine, Ser: 0.76 mg/dL (ref 0.40–1.20)
GFR: 76.92 mL/min (ref >60.00)
Glucose, Bld: 96 mg/dL (ref 70–99)
Potassium: 5.1 mEq/L (ref 3.5–5.1)
Sodium: 141 mEq/L (ref 135–145)
Total Bilirubin: 0.7 mg/dL (ref 0.2–1.2)
Total Protein: 7.3 g/dL (ref 6.0–8.3)

## 2021-09-14 LAB — VITAMIN D 25 HYDROXY (VIT D DEFICIENCY, FRACTURES): VITD: 36.88 ng/mL (ref 30.00–100.00)

## 2021-09-14 LAB — VITAMIN B12: Vitamin B-12: >1550 pg/mL — ABNORMAL HIGH (ref 211–911)

## 2021-09-14 LAB — TSH: TSH: 0.61 u[IU]/mL (ref 0.35–5.50)

## 2021-09-14 LAB — HEMOGLOBIN A1C: Hgb A1c MFr Bld: 5.6 % (ref 4.6–6.5)

## 2021-09-14 NOTE — Patient Instructions (Signed)
Health Maintenance After Age 75 After age 75, you are at a higher risk for certain long-term diseases and infections as well as injuries from falls. Falls are a major cause of broken bones and head injuries in people who are older than age 75. Getting regular preventive care can help to keep you healthy and well. Preventive care includes getting regular testing and making lifestyle changes as recommended by your health care provider. Talk with your health care provider about: Which screenings and tests you should have. A screening is a test that checks for a disease when you have no symptoms. A diet and exercise plan that is right for you. What should I know about screenings and tests to prevent falls? Screening and testing are the best ways to find a health problem early. Early diagnosis and treatment give you the best chance of managing medical conditions that are common after age 75. Certain conditions and lifestyle choices may make you more likely to have a fall. Your health care provider may recommend: Regular vision checks. Poor vision and conditions such as cataracts can make you more likely to have a fall. If you wear glasses, make sure to get your prescription updated if your vision changes. Medicine review. Work with your health care provider to regularly review all of the medicines you are taking, including over-the-counter medicines. Ask your health care provider about any side effects that may make you more likely to have a fall. Tell your health care provider if any medicines that you take make you feel dizzy or sleepy. Osteoporosis screening. Osteoporosis is a condition that causes the bones to get weaker. This can make the bones weak and cause them to break more easily. Blood pressure screening. Blood pressure changes and medicines to control blood pressure can make you feel dizzy. Strength and balance checks. Your health care provider may recommend certain tests to check your strength and  balance while standing, walking, or changing positions. Foot health exam. Foot pain and numbness, as well as not wearing proper footwear, can make you more likely to have a fall. Depression screening. You may be more likely to have a fall if you have a fear of falling, feel emotionally low, or feel unable to do activities that you used to do. Alcohol use screening. Using too much alcohol can affect your balance and may make you more likely to have a fall. What actions can I take to lower my risk of falls? General instructions Talk with your health care provider about your risks for falling. Tell your health care provider if: You fall. Be sure to tell your health care provider about all falls, even ones that seem minor. You feel dizzy, sleepy, or off-balance. Take over-the-counter and prescription medicines only as told by your health care provider. These include any supplements. Eat a healthy diet and maintain a healthy weight. A healthy diet includes low-fat dairy products, low-fat (lean) meats, and fiber from whole grains, beans, and lots of fruits and vegetables. Home safety Remove any tripping hazards, such as rugs, cords, and clutter. Install safety equipment such as grab bars in bathrooms and safety rails on stairs. Keep rooms and walkways well-lit. Activity  Follow a regular exercise program to stay fit. This will help you maintain your balance. Ask your health care provider what types of exercise are appropriate for you. If you need a cane or walker, use it as recommended by your health care provider. Wear supportive shoes that have nonskid soles. Lifestyle Do not   drink alcohol if your health care provider tells you not to drink. If you drink alcohol, limit how much you have: 0-1 drink a day for women. 0-2 drinks a day for men. Be aware of how much alcohol is in your drink. In the [place] one drink equals one typical bottle of beer (12 oz), one-half glass of wine (5 oz), or one shot of  hard liquor (1 oz). Do not use any products that contain nicotine or tobacco, such as cigarettes and e-cigarettes. If you need help quitting, ask your health care provider. Summary Having a healthy lifestyle and getting preventive care can help to protect your health and wellness after age 75. Screening and testing are the best way to find a health problem early and help you avoid having a fall. Early diagnosis and treatment give you the best chance for managing medical conditions that are more common for people who are older than age 75. Falls are a major cause of broken bones and head injuries in people who are older than age 75. Take precautions to prevent a fall at home. Work with your health care provider to learn what changes you can make to improve your health and wellness and to prevent falls. This information is not intended to replace advice given to you by your health care provider. Make sure you discuss any questions you have with your health care provider. Document Revised: 01/14/2021 Document Reviewed: 10/22/2020 Elsevier Patient Education  2022 Elsevier Inc.  

## 2021-09-14 NOTE — Assessment & Plan Note (Signed)
Musculoskeletal in nature most likely.  Will obtain chest x-ray today.  Advised to take Tylenol as needed.

## 2021-09-14 NOTE — Assessment & Plan Note (Signed)
Differential diagnosis discussed.  May be metabolic, hormonal, and or stress related.  Blood work done today.

## 2021-09-14 NOTE — Progress Notes (Signed)
[name] 75 y.o.   Chief Complaint  Patient presents with   Hip Pain    Right side, x 1 wk. Patient states that she has multi concerns with eyes, ribs, blood in stool.    HISTORY OF PRESENT ILLNESS: This is a 75 y.o. female with multiple different complaints: 1.  Had 1 episode of melena about 1 week after getting multiple vaccines on September 12th.  Patient was given COVID, shingles, and pneumonia vaccines.  No recurrent episodes.  Patient has history of colon cancer and gets colonoscopies yearly.  Scheduled for next colonoscopy on September 27, 2021. 2.  Hair loss for the past 2 weeks.  Could be stress related.  Daughter getting a divorce. 3.  Left rib cage pain without injuries for the past couple of weeks.  No associated symptoms.  Hurts to touch. 4.  Pain to right hip for several weeks 5.  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 Authorizing Provider  acetaminophen (TYLENOL) 500 MG tablet Take 500 mg by mouth every 6 (six) hours as needed for moderate pain or mild pain.   Yes [provider]  atorvastatin (LIPITOR) 20 MG tablet Take 1 tablet (20 mg total) by mouth daily. 04/13/21  Yes Cirigliano, Mary K, DO  calcium carbonate (OS-CAL) 600 MG TABS tablet Take 600 mg by mouth daily with breakfast.   Yes [provider]  lidocaine-prilocaine (EMLA) cream Apply 1 application topically as needed. 08/10/21  Yes Truitt Merle, MD  ondansetron (ZOFRAN ODT) 4 MG disintegrating tablet Take 1 tablet (4 mg total) by mouth every 8 (eight) hours as needed for nausea or vomiting. 06/17/21  Yes Volney American, PA-C  polycarbophil (FIBERCON) 625 MG tablet Take 625 mg by mouth daily.   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   Effusion of joint of right upper arm 11/29/2019   Rectal cancer ([place]) 08/25/2019    Past Medical History:  Diagnosis Date   Anxiety    Arthritis    Cataract    Colon cancer ([place])    History of kidney stones     in system   Kidney stones    Macular degeneration    Neuromuscular disorder ([place])    Osteoporosis    Rectal cancer ([place])    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[place])     Past Surgical History:  Procedure Laterality Date   ABDOMINAL HYSTERECTOMY     COLONOSCOPY  10/07/2019   EYE SURGERY Bilateral    cataract   SPINE SURGERY  2017   schwannoma of spinal cord   TONSILLECTOMY     TOOTH EXTRACTION N/A 11/26/2020   Procedure: DENTAL RESTORATION/EXTRACTIONS;  Surgeon: Diona Browner, DMD;  Location: [facility];  Service: Oral Surgery;  Laterality: N/A;    Social History   Socioeconomic History   Marital status: Significant Other    Spouse name: Not on file   Number of children: 1   Years of education: Not on file   Highest education level:  Not on file  Occupational History   Not on file  Tobacco Use   Smoking status: Former    Packs/day: 2.00    Years: 26.00    Pack years: 52.00    Types: Cigarettes    Quit date: 11/21/1987    Years since quitting: 33.8   Smokeless tobacco: Never  Vaping Use   Vaping Use: Never used  Substance and Sexual Activity   Alcohol use: Yes    Alcohol/week: 1.0 standard drink    Types: 1 Glasses of wine per week    Comment: wine socially   Drug use: Yes    Types: Other-see comments    Comment: CBD   Sexual activity: Yes  Other Topics Concern   Not on file  Social History Narrative   Not on file   Social Determinants of Health   Financial Resource Strain: Not on file  Food Insecurity: Not on file  Transportation Needs: Not on  file  Physical Activity: Not on file  Stress: Not on file  Social Connections: Not on file  Intimate Partner Violence: Not on file    Family History  Problem Relation Age of Onset   Cancer Mother        head and neck cancer    Cancer Sister        lung cancer    Cancer Brother        pancreatic cancer    Pancreatic cancer Brother    Cancer Paternal Grandmother        breast cancer    Colon cancer Neg Hx    Esophageal cancer Neg Hx    Rectal cancer Neg Hx    Stomach cancer Neg Hx    Inflammatory bowel disease Neg Hx    Liver disease Neg Hx      Review of Systems  Constitutional: Negative.  Negative for chills and fever.  HENT: Negative.  Negative for congestion and sore throat.   Respiratory: Negative.  Negative for cough and shortness of breath.   Cardiovascular: Negative.  Negative for chest pain and palpitations.  Gastrointestinal:  Positive for melena (1 episode). Negative for abdominal pain, diarrhea, nausea and vomiting.  Genitourinary: Negative.  Negative for dysuria and hematuria.  Skin: Negative.  Negative for rash.  Neurological: Negative.  Negative for dizziness and headaches.  All other systems reviewed and are negative.  Today's Vitals   09/14/21 1033  BP: 122/78  Pulse: 71  SpO2: 97%  Weight: 143 lb (64.9 kg)  Height: 5\' 3"  (1.6 m)   Body mass index is 25.33 kg/m.  Physical Exam Vitals reviewed.  Constitutional:      Appearance: Normal appearance.  HENT:     Head: Normocephalic.     Mouth/Throat:     Mouth: Mucous membranes are moist.     Pharynx: Oropharynx is clear.  Eyes:     Extraocular Movements: Extraocular movements intact.     Conjunctiva/sclera: Conjunctivae normal.     Pupils: Pupils are equal, round, and reactive to light.  Cardiovascular:     Rate and Rhythm: Normal rate and regular rhythm.     Pulses: Normal pulses.     Heart sounds: Normal heart sounds.  Pulmonary:     Effort: Pulmonary effort is normal.     Breath sounds:  Normal breath sounds.  Musculoskeletal:     Cervical back: Normal range of motion and neck supple. No tenderness.     Comments: Right hip: Full range of motion.  No  localized tenderness. Left rib cage: Mild tenderness to palpation left anterior lower rib area  Lymphadenopathy:     Cervical: No cervical adenopathy.  Skin:    General: Skin is warm and dry.     Capillary Refill: Capillary refill takes less than 2 seconds.  Neurological:     General: No focal deficit present.     Mental Status: She is alert and oriented to person, place, and time.  Psychiatric:        Mood and Affect: Mood normal.        Behavior: Behavior normal.     ASSESSMENT & PLAN: A total of 45 minutes was spent with the patient and counseling/coordination of care regarding preparing for this visit, review of most recent office visit notes, review of past medical history, evaluation of multiple acute problems, differential diagnosis of several different conditions, prognosis, comprehensive history and physical examination, documentation, need for x-rays, need for colonoscopy, and need for follow-up.  Problem List Items Addressed This Visit       Digestive   Melena - Primary    Only 1 episode several weeks ago.  No recurrence.  Patient has history of colon rectal cancer.  Scheduled for colonoscopy November 8th.      Relevant Orders   CBC with Differential/Platelet   Comprehensive metabolic panel     Other   Rib pain on left side    Musculoskeletal in nature most likely.  Will obtain chest x-ray today.  Advised to take Tylenol as needed.      Relevant Orders   DG Chest 2 View   Right hip pain    Differential diagnosis discussed with patient.  Will get x-ray today.  May take Tylenol for pain as needed.  Has history of osteoarthritis.      Relevant Orders   DG HIP UNILAT W OR W/O PELVIS 2-3 VIEWS RIGHT   Loss of hair    Differential diagnosis discussed.  May be metabolic, hormonal, and or stress related.   Blood work done today.      Relevant Orders   CBC with Differential/Platelet   Comprehensive metabolic panel   TSH   Hemoglobin A1c   Lipid panel   Vitamin B12   VITAMIN D 25 Hydroxy (Vit-D Deficiency, Fractures)   Bilateral chronic knee pain   Patient Instructions  Health Maintenance After Age 68 After age 51, you are at a higher risk for certain long-term diseases and infections as well as injuries from falls. Falls are a major cause of broken bones and head injuries in people who are older than age 53. Getting regular preventive care can help to keep you healthy and well. Preventive care includes getting regular testing and making lifestyle changes as recommended by your health care provider. Talk with your health care provider about: Which screenings and tests you should have. A screening is a test that checks for a disease when you have no symptoms. A diet and exercise plan that is right for you. What should I know about screenings and tests to prevent falls? Screening and testing are the best ways to find a health problem early. Early diagnosis and treatment give you the best chance of managing medical conditions that are common after age 68. Certain conditions and lifestyle choices may make you more likely to have a fall. Your health care provider may recommend: Regular vision checks. Poor vision and conditions such as cataracts can make you more likely to have a fall. If you wear glasses,  make sure to get your prescription updated if your vision changes. Medicine review. Work with your health care provider to regularly review all of the medicines you are taking, including over-the-counter medicines. Ask your health care provider about any side effects that may make you more likely to have a fall. Tell your health care provider if any medicines that you take make you feel dizzy or sleepy. Osteoporosis screening. Osteoporosis is a condition that causes the bones to get weaker. This can  make the bones weak and cause them to break more easily. Blood pressure screening. Blood pressure changes and medicines to control blood pressure can make you feel dizzy. Strength and balance checks. Your health care provider may recommend certain tests to check your strength and balance while standing, walking, or changing positions. Foot health exam. Foot pain and numbness, as well as not wearing proper footwear, can make you more likely to have a fall. Depression screening. You may be more likely to have a fall if you have a fear of falling, feel emotionally low, or feel unable to do activities that you used to do. Alcohol use screening. Using too much alcohol can affect your balance and may make you more likely to have a fall. What actions can I take to lower my risk of falls? General instructions Talk with your health care provider about your risks for falling. Tell your health care provider if: You fall. Be sure to tell your health care provider about all falls, even ones that seem minor. You feel dizzy, sleepy, or off-balance. Take over-the-counter and prescription medicines only as told by your health care provider. These include any supplements. Eat a healthy diet and maintain a healthy weight. A healthy diet includes low-fat dairy products, low-fat (lean) meats, and fiber from whole grains, beans, and lots of fruits and vegetables. Home safety Remove any tripping hazards, such as rugs, cords, and clutter. Install safety equipment such as grab bars in bathrooms and safety rails on stairs. Keep rooms and walkways well-lit. Activity  Follow a regular exercise program to stay fit. This will help you maintain your balance. Ask your health care provider what types of exercise are appropriate for you. If you need a cane or walker, use it as recommended by your health care provider. Wear supportive shoes that have nonskid soles. Lifestyle Do not drink alcohol if your health care provider  tells you not to drink. If you drink alcohol, limit how much you have: 0-1 drink a day for women. 0-2 drinks a day for men. Be aware of how much alcohol is in your drink. In the [place] one drink equals one typical bottle of beer (12 oz), one-half glass of wine (5 oz), or one shot of hard liquor (1 oz). Do not use any products that contain nicotine or tobacco, such as cigarettes and e-cigarettes. If you need help quitting, ask your health care provider. Summary Having a healthy lifestyle and getting preventive care can help to protect your health and wellness after age 71. Screening and testing are the best way to find a health problem early and help you avoid having a fall. Early diagnosis and treatment give you the best chance for managing medical conditions that are more common for people who are older than age 44. Falls are a major cause of broken bones and head injuries in people who are older than age 80. Take precautions to prevent a fall at home. Work with your health care provider to learn what changes you  can make to improve your health and wellness and to prevent falls. This information is not intended to replace advice given to you by your health care provider. Make sure you discuss any questions you have with your health care provider. Document Revised: 01/14/2021 Document Reviewed: 10/22/2020 Elsevier Patient Education  2022 [place], MD [facility]

## 2021-09-14 NOTE — Assessment & Plan Note (Signed)
Differential diagnosis discussed with patient.  Will get x-ray today.  May take Tylenol for pain as needed.  Has history of osteoarthritis.

## 2021-09-14 NOTE — Assessment & Plan Note (Signed)
Only 1 episode several weeks ago.  No recurrence.  Patient has history of colon rectal cancer.  Scheduled for colonoscopy November 8th.

## 2021-09-19 ENCOUNTER — Other Ambulatory Visit: Payer: Self-pay | Admitting: Gastroenterology

## 2021-09-19 ENCOUNTER — Ambulatory Visit: Payer: Medicaid Other | Admitting: *Deleted

## 2021-09-19 ENCOUNTER — Other Ambulatory Visit: Payer: Self-pay

## 2021-09-19 VITALS — Ht 63.0 in | Wt 140.0 lb

## 2021-09-19 DIAGNOSIS — C20 Malignant neoplasm of rectum: Secondary | ICD-10-CM

## 2021-09-19 DIAGNOSIS — Z86010 Personal history of colonic polyps: Secondary | ICD-10-CM

## 2021-09-19 MED ORDER — NA SULFATE-K SULFATE-MG SULF 17.5-3.13-1.6 GM/177ML PO SOLN: 1.0000 | Freq: Once | ORAL | 0 refills | Status: DC

## 2021-09-19 NOTE — Progress Notes (Signed)
Pt's previsit is done over the phone and all paperwork (prep instructions, blank consent form to just read over) sent to patient and MyChart.  Pt's name and DOB verified at the beginning of the previsit.  Pt denies any difficulty with ambulating.     No trouble with anesthesia, denies being told they were difficult to intubate, or hx/fam hx of malignant hyperthermia per pt   No egg or soy allergy  No home oxygen use   No medications for weight loss taken  Pt denies constipation issues  Pt informed that we do not do prior authorizations for prep

## 2021-09-20 ENCOUNTER — Telehealth: Payer: Self-pay

## 2021-09-20 NOTE — Telephone Encounter (Signed)
This nurse received a message from this patient stating that she was told by her PCP that her Calcium levels are too high, and they recommended reaching out to the Oncologist to see if patient should decrease or stop taking Calcium.  Patient would also like to inform provider that her Colonoscopy is scheduled for 09/27/2021. No further questions or concerns at this time. This information is forwarded to MD for advice.

## 2021-09-22 ENCOUNTER — Telehealth: Payer: Self-pay

## 2021-09-22 NOTE — Telephone Encounter (Signed)
This nurse spoke with patient related to Calcium recommendations.  Made patient aware per MD that she does not have any specific recommendations regarding her oral calcium supplement, please let her f/u with her PCP's recommendations.  Patient acknowledged understanding and will speak with her PCP.

## 2021-09-27 ENCOUNTER — Other Ambulatory Visit: Payer: Self-pay

## 2021-09-27 ENCOUNTER — Ambulatory Visit (AMBULATORY_SURGERY_CENTER): Payer: Medicaid Other | Admitting: Gastroenterology

## 2021-09-27 ENCOUNTER — Encounter: Payer: Self-pay | Admitting: Gastroenterology

## 2021-09-27 VITALS — BP 111/60 | HR 58 | Temp 97.9°F | Resp 12 | Ht 63.0 in | Wt 140.0 lb

## 2021-09-27 DIAGNOSIS — Z86010 Personal history of colonic polyps: Secondary | ICD-10-CM

## 2021-09-27 DIAGNOSIS — Z800 Family history of malignant neoplasm of digestive organs: Secondary | ICD-10-CM

## 2021-09-27 DIAGNOSIS — K921 Melena: Secondary | ICD-10-CM | POA: Diagnosis not present

## 2021-09-27 DIAGNOSIS — D122 Benign neoplasm of ascending colon: Secondary | ICD-10-CM | POA: Diagnosis not present

## 2021-09-27 DIAGNOSIS — C20 Malignant neoplasm of rectum: Secondary | ICD-10-CM

## 2021-09-27 DIAGNOSIS — Z85048 Personal history of other malignant neoplasm of rectum, rectosigmoid junction, and anus: Secondary | ICD-10-CM | POA: Diagnosis not present

## 2021-09-27 HISTORY — PX: COLONOSCOPY: SHX174

## 2021-09-27 MED ORDER — SODIUM CHLORIDE 0.9 % IV SOLN: 500.0000 mL | Freq: Once | INTRAVENOUS | Status: DC

## 2021-09-27 NOTE — Patient Instructions (Addendum)
Handouts were given to your care partner on polyps, diverticulosis, hemorrhoids, and a high fiber diet with liberal fluid intake. Add over the counter FiberCon 1-2 tablets daily. You may resume your current medications today. Await biopsy results.  May take 1-3 weeks to receive pathology results. Will proceed with referral to Colorectal surgery to discuss whether hemorrhoidectomy may be of some assistance to you for your symptoms. Recommend obtaining flushable wet wipes to use as needed if on the go and need to use restroom for cleaning purposes. Please call if any questions or concerns.      YOU HAD AN ENDOSCOPIC PROCEDURE TODAY AT [facility]:   Refer to the procedure report that was given to you for any specific questions about what was found during the examination.  If the procedure report does not answer your questions, please call your gastroenterologist to clarify.  If you requested that your care partner not be given the details of your procedure findings, then the procedure report has been included in a sealed envelope for you to review at your convenience later.  YOU SHOULD EXPECT: Some feelings of bloating in the abdomen. Passage of more gas than usual.  Walking can help get rid of the air that was put into your GI tract during the procedure and reduce the bloating. If you had a lower endoscopy (such as a colonoscopy or flexible sigmoidoscopy) you may notice spotting of blood in your stool or on the toilet paper. If you underwent a bowel prep for your procedure, you may not have a normal bowel movement for a few days.  Please Note:  You might notice some irritation and congestion in your nose or some drainage.  This is from the oxygen used during your procedure.  There is no need for concern and it should clear up in a day or so.  SYMPTOMS TO REPORT IMMEDIATELY:  Following lower endoscopy (colonoscopy or flexible sigmoidoscopy):  Excessive amounts of blood in the  stool  Significant tenderness or worsening of abdominal pains  Swelling of the abdomen that is new, acute  Fever of 100F or higher   For urgent or emergent issues, a gastroenterologist can be reached at any hour by calling [phone]. Do not use MyChart messaging for urgent concerns.    DIET:  We do recommend a small meal at first, but then you may proceed to your regular diet.  Drink plenty of fluids but you should avoid alcoholic beverages for 24 hours.  ACTIVITY:  You should plan to take it easy for the rest of today and you should NOT DRIVE or use heavy machinery until tomorrow (because of the sedation medicines used during the test).    FOLLOW UP: Our staff will call the number listed on your records 48-72 hours following your procedure to check on you and address any questions or concerns that you may have regarding the information given to you following your procedure. If we do not reach you, we will leave a message.  We will attempt to reach you two times.  During this call, we will ask if you have developed any symptoms of COVID 19. If you develop any symptoms (ie: fever, flu-like symptoms, shortness of breath, cough etc.) before then, please call [phone].  If you test positive for Covid 19 in the 2 weeks post procedure, please call and report this information to [place].    If any biopsies were taken you will be contacted by phone or by letter within  the next 1-3 weeks.  Please call us at [phone] if you have not heard about the biopsies in 3 weeks.    SIGNATURES/CONFIDENTIALITY: You and/or your care partner have signed paperwork which will be entered into your electronic medical record.  These signatures attest to the fact that that the information above on your After Visit Summary has been reviewed and is understood.  Full responsibility of the confidentiality of this discharge information lies with you and/or your care-partner.

## 2021-09-27 NOTE — Progress Notes (Signed)
Called to room to assist during endoscopic procedure.  Patient ID and intended procedure confirmed with present staff. Received instructions for my participation in the procedure from the performing physician.  

## 2021-09-27 NOTE — Progress Notes (Signed)
Sedate, gd SR, tolerated procedure well, VSS, report to RN 

## 2021-09-27 NOTE — Progress Notes (Signed)
Pt's states no medical or surgical changes since previsit or office visit.  ° °Vitals CW °

## 2021-09-27 NOTE — Progress Notes (Signed)
No problems noted in the recovery room. Pt was asleep when Dr. Elisabeth Most came in to go over the findings.  He will speak with pt after he finishes the next procedure.  maw

## 2021-09-27 NOTE — Op Note (Signed)
[facility] Patient Name: [name] Procedure Date: 09/27/2021 8:35 AM MRN: [id number] Endoscopist: Justice Britain , MD Age: 75 Referring MD:  Date of Birth: March 08, 1946 Gender: Female Account #: [id number] Procedure:                Colonoscopy Indications:              High risk colon cancer surveillance: Personal                            history of non-advanced adenoma, High risk colon                            cancer surveillance: Personal history of rectal                            cancer Medicines:                Monitored Anesthesia Care Procedure:                Pre-Anesthesia Assessment:                           - Prior to the procedure, a History and Physical                            was performed, and patient medications and                            allergies were reviewed. The patient's tolerance of                            previous anesthesia was also reviewed. The risks                            and benefits of the procedure and the sedation                            options and risks were discussed with the patient.                            All questions were answered, and informed consent                            was obtained. Prior Anticoagulants: The patient has                            taken no previous anticoagulant or antiplatelet                            agents. ASA Grade Assessment: III - A patient with                            severe systemic disease. After reviewing the risks  and benefits, the patient was deemed in                            satisfactory condition to undergo the procedure.                           After obtaining informed consent, the colonoscope                            was passed under direct vision. Throughout the                            procedure, the patient's blood pressure, pulse, and                            oxygen saturations were monitored  continuously. The                            Olympus PCF-H190DL (#[id number]) Colonoscope was                            introduced through the anus and advanced to the 5                            cm into the ileum. The colonoscopy was performed                            without difficulty. The patient tolerated the                            procedure. The quality of the bowel preparation was                            good. The terminal ileum, ileocecal valve,                            appendiceal orifice, and rectum were photographed. Scope In: 8:42:16 AM Scope Out: 9:79:89 AM Scope Withdrawal Time: 0 hours 10 minutes 19 seconds  Total Procedure Duration: 0 hours 13 minutes 52 seconds  Findings:                 The digital rectal exam findings include                            hemorrhoids. Pertinent negatives include no                            palpable rectal lesions.                           The terminal ileum and ileocecal valve appeared                            normal.  A 7 mm polyp was found in the proximal ascending                            colon. The polyp was sessile. The polyp was removed                            with a cold snare. Resection and retrieval were                            complete.                           A few small-mouthed diverticula were found in the                            entire colon.                           Normal mucosa was found in the entire colon                            otherwise.                           Non-bleeding non-thrombosed external and internal                            hemorrhoids were found during retroflexion, during                            perianal exam and during digital exam. The                            hemorrhoids were Grade III (internal hemorrhoids                            that prolapse but require manual reduction). Complications:            No immediate  complications. Estimated Blood Loss:     Estimated blood loss was minimal. Impression:               - Hemorrhoids found on digital rectal exam.                           - The examined portion of the ileum was normal.                           - One 7 mm polyp in the proximal ascending colon,                            removed with a cold snare. Resected and retrieved.                           - A few scattered diverticulosis noted in the  entire examined colon.                           - Normal mucosa in the entire examined colon                            otherwise.                           - Non-bleeding non-thrombosed external and internal                            hemorrhoids. Recommendation:           - The patient will be observed post-procedure,                            until all discharge criteria are met.                           - Discharge patient to home.                           - Patient has a contact number available for                            emergencies. The signs and symptoms of potential                            delayed complications were discussed with the                            patient. Return to normal activities tomorrow.                            Written discharge instructions were provided to the                            patient.                           - High fiber diet.                           - Use FiberCon 1-2 tablets PO daily.                           - Continue present medications.                           - Await pathology results.                           - Repeat colonoscopy in 1-2 years for surveillance                            with prior rectal cancer and discussion with  Oncology.                           - Will proceed with referral to Colorectal surgery                            to discuss whether hemorrhoidectomy may be of some                             assitance to you for your symptoms.                           - Recommend obtaing flushable wet wipes to use as                            needed if on the go and need to use restroom for                            cleaning purposes.                           - The findings and recommendations were discussed                            with the patient.                           - The findings and recommendations were discussed                            with the designated responsible adult. Justice Britain, MD 09/27/2021 9:03:21 AM

## 2021-09-27 NOTE — Progress Notes (Signed)
GASTROENTEROLOGY PROCEDURE H&P NOTE   Primary Care Physician: Horald Pollen, MD  HPI: [name] is a 75 y.o. female who presents for Colonoscopy for surveillance of prior colorectal cancer and colon polyps.  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 90 tablet 3   Cholecalciferol (VITAMIN D3) 25 MCG (1000 UT) CAPS Take by mouth daily.     lidocaine-prilocaine (EMLA) cream Apply 1 application topically as needed. 30 g 1   acetaminophen (TYLENOL) 500 MG tablet Take 500 mg by mouth every 6 (six) hours as needed for moderate pain or mild pain.     calcium carbonate (OS-CAL) 600 MG TABS tablet Take 600 mg by mouth daily with breakfast. (Patient not taking: Reported on 09/27/2021)     ondansetron (ZOFRAN ODT) 4 MG disintegrating tablet Take 1 tablet (4 mg total) by mouth every 8 (eight) hours as needed for nausea or vomiting. 20 tablet 0   polycarbophil (FIBERCON) 625 MG tablet Take 625 mg by mouth daily.     TRAMADOL HCL PO Take by mouth daily as needed.     valACYclovir HCl (VALTREX PO) Take by mouth. Takes PRN      Current Facility-Administered Medications  Medication Dose Route Frequency Provider Last Rate Last Admin   0.9 %  sodium chloride infusion  500 mL Intravenous Once Mansouraty, Telford Nab., MD        Current Outpatient Medications:    atorvastatin (LIPITOR) 20 MG tablet, Take 1 tablet (20 mg total) by mouth daily., Disp: 90 tablet, Rfl: 3   Cholecalciferol (VITAMIN D3) 25 MCG (1000 UT) CAPS, Take by mouth daily., Disp: , Rfl:    lidocaine-prilocaine (EMLA) cream, Apply 1 application topically as needed., Disp: 30 g, Rfl: 1   acetaminophen (TYLENOL) 500 MG tablet, Take 500 mg by mouth every 6 (six) hours as needed for moderate pain or mild pain., Disp: , Rfl:    calcium carbonate (OS-CAL) 600 MG TABS tablet, Take 600 mg by mouth daily with breakfast. (Patient not taking: Reported on 09/27/2021), Disp: , Rfl:    ondansetron (ZOFRAN ODT) 4 MG disintegrating tablet, Take 1 tablet (4 mg total) by mouth every 8 (eight) hours as needed for nausea or vomiting., Disp: 20 tablet, Rfl: 0   polycarbophil (FIBERCON) 625 MG tablet, Take 625 mg by mouth daily., Disp: , Rfl:    TRAMADOL HCL PO, Take by mouth daily as needed., Disp: ,  Rfl:    valACYclovir HCl (VALTREX PO), Take by mouth. Takes PRN, Disp: , Rfl:   Current Facility-Administered Medications:    0.9 %  sodium chloride infusion, 500 mL, Intravenous, Once, Mansouraty, Telford Nab., MD Allergies  Allergen Reactions   Morphine And Related Anaphylaxis   Other Other (See Comments)    Stitches reject   Family History  Problem Relation Age of Onset   Cancer Mother        head and neck cancer    Cancer Sister        lung cancer    Cancer Brother        pancreatic cancer    Pancreatic cancer Brother    Cancer Paternal Grandmother        breast cancer    Colon cancer Neg Hx    Esophageal cancer Neg Hx    Rectal cancer Neg Hx    Stomach cancer Neg Hx    Inflammatory bowel disease Neg Hx    Liver disease Neg Hx    Social History    Socioeconomic History   Marital status: Significant Other    Spouse name: Not on file   Number of children: 1   Years of education: Not on file   Highest education level: Not on file  Occupational History   Not on file  Tobacco Use   Smoking status: Former    Packs/day: 2.00    Years: 26.00    Pack years: 52.00    Types: Cigarettes    Quit date: 11/21/1987    Years since quitting: 33.8   Smokeless tobacco: Never  Vaping Use   Vaping Use: Never used  Substance and Sexual Activity   Alcohol use: Yes    Alcohol/week: 1.0 standard drink    Types: 1 Glasses of wine per week    Comment: wine socially   Drug use: Yes    Types: Other-see comments    Comment: CBD   Sexual activity: Yes  Other Topics Concern   Not on file  Social History Narrative   Not on file   Social Determinants of Health   Financial Resource Strain: Not on file  Food Insecurity: Not on file  Transportation Needs: Not on file  Physical Activity: Not on file  Stress: Not on file  Social Connections: Not on file  Intimate Partner Violence: Not on file    Physical Exam: Today's Vitals   09/27/21 0736  BP: 124/64  Pulse: 64  Temp: 97.9 F (36.6 C)  TempSrc: Temporal  SpO2: 98%  Weight: 140 lb (63.5 kg)  Height: 5\' 3"  (1.6 m)   Body mass index is 24.8 kg/m. GEN: NAD EYE: Sclerae anicteric ENT: MMM CV: Non-tachycardic GI: Soft, NT/ND NEURO:  Alert & Oriented x 3  Lab Results: No results for input(s): WBC, HGB, HCT, PLT in the last 72 hours. BMET No results for input(s): NA, K, CL, CO2, GLUCOSE, BUN, CREATININE, CALCIUM in the last 72 hours. LFT No results for input(s): PROT, ALBUMIN, AST, ALT, ALKPHOS, BILITOT, BILIDIR, IBILI in the last 72 hours. PT/INR No results for input(s): LABPROT, INR in the last 72 hours.   Impression / Plan: This is a 75 y.o.female who presents for Colonoscopy for surveillance of prior colorectal cancer and colon polyps.  The risks and benefits of  endoscopic evaluation/treatment were discussed with the patient and/or family; these include but are not limited to the risk of perforation, infection, bleeding, missed lesions, lack of diagnosis, severe illness requiring  hospitalization, as well as anesthesia and sedation related illnesses.  The patient's history has been reviewed, patient examined, no change in status, and deemed stable for procedure.  The patient and/or family is agreeable to proceed.    Justice Britain, MD [facility] Office # [phone]

## 2021-09-28 ENCOUNTER — Encounter: Payer: Self-pay | Admitting: Hematology

## 2021-09-28 ENCOUNTER — Telehealth: Payer: Self-pay

## 2021-09-28 NOTE — Telephone Encounter (Signed)
Referal has been made to [facility] to discuss hemorroidectomy.  Records faxed

## 2021-09-29 ENCOUNTER — Telehealth: Payer: Self-pay

## 2021-09-29 NOTE — Telephone Encounter (Signed)
  Follow up Call-  Call back number 09/27/2021 10/27/2020 10/07/2019  Post procedure Call Back phone  # [phone]  Permission to leave phone message Yes Yes Yes     Patient questions:  Do you have a fever, pain , or abdominal swelling? No. Pain Score  0 *  Have you tolerated food without any problems? Yes.    Have you been able to return to your normal activities? Yes.    Do you have any questions about your discharge instructions: Diet   No. Medications  No. Follow up visit  No.  Do you have questions or concerns about your Care? No.  Actions: * If pain score is 4 or above: No action needed, pain <4.  Have you developed a fever since your procedure? no  2.   Have you had an respiratory symptoms (SOB or cough) since your procedure? no  3.   Have you tested positive for COVID 19 since your procedure no  4.   Have you had any family members/close contacts diagnosed with the COVID 19 since your procedure?  no   If yes to any of these questions please route to Joylene John, RN and Joella Prince, RN

## 2021-09-30 ENCOUNTER — Ambulatory Visit: Payer: Medicaid Other | Admitting: Gastroenterology

## 2021-10-03 ENCOUNTER — Encounter: Payer: Self-pay | Admitting: Gastroenterology

## 2021-10-12 ENCOUNTER — Inpatient Hospital Stay: Payer: Medicaid Other | Attending: Hematology

## 2021-10-12 ENCOUNTER — Other Ambulatory Visit: Payer: Self-pay

## 2021-10-12 DIAGNOSIS — M810 Age-related osteoporosis without current pathological fracture: Secondary | ICD-10-CM

## 2021-10-12 DIAGNOSIS — C20 Malignant neoplasm of rectum: Secondary | ICD-10-CM | POA: Insufficient documentation

## 2021-10-12 DIAGNOSIS — Z452 Encounter for adjustment and management of vascular access device: Secondary | ICD-10-CM | POA: Insufficient documentation

## 2021-10-12 DIAGNOSIS — Z95828 Presence of other vascular implants and grafts: Secondary | ICD-10-CM

## 2021-10-12 MED ORDER — HEPARIN SOD (PORK) LOCK FLUSH 100 UNIT/ML IV SOLN
500.0000 [IU] | Freq: Once | INTRAVENOUS | Status: AC
  Administered 2021-10-12: 500 [IU]

## 2021-10-12 MED ORDER — SODIUM CHLORIDE 0.9% FLUSH
10.0000 mL | Freq: Once | INTRAVENOUS | Status: AC
  Administered 2021-10-12: 10 mL

## 2021-11-07 ENCOUNTER — Inpatient Hospital Stay: Payer: Medicaid Other | Attending: Hematology

## 2021-11-07 ENCOUNTER — Other Ambulatory Visit: Payer: Self-pay

## 2021-11-07 DIAGNOSIS — K642 Third degree hemorrhoids: Secondary | ICD-10-CM | POA: Diagnosis not present

## 2021-11-07 DIAGNOSIS — Z452 Encounter for adjustment and management of vascular access device: Secondary | ICD-10-CM | POA: Insufficient documentation

## 2021-11-07 DIAGNOSIS — C20 Malignant neoplasm of rectum: Secondary | ICD-10-CM | POA: Insufficient documentation

## 2021-11-07 DIAGNOSIS — Z923 Personal history of irradiation: Secondary | ICD-10-CM | POA: Diagnosis not present

## 2021-11-07 DIAGNOSIS — M810 Age-related osteoporosis without current pathological fracture: Secondary | ICD-10-CM

## 2021-11-07 DIAGNOSIS — K644 Residual hemorrhoidal skin tags: Secondary | ICD-10-CM | POA: Diagnosis not present

## 2021-11-07 DIAGNOSIS — Z95828 Presence of other vascular implants and grafts: Secondary | ICD-10-CM

## 2021-11-07 DIAGNOSIS — Z85048 Personal history of other malignant neoplasm of rectum, rectosigmoid junction, and anus: Secondary | ICD-10-CM | POA: Diagnosis not present

## 2021-11-07 MED ORDER — ALTEPLASE 2 MG IJ SOLR
2.0000 mg | Freq: Once | INTRAMUSCULAR | Status: AC
  Administered 2021-11-07: 10:00:00 2 mg
  Filled 2021-11-07: qty 2

## 2021-11-07 MED ORDER — SODIUM CHLORIDE 0.9% FLUSH
10.0000 mL | Freq: Once | INTRAVENOUS | Status: AC
Start: 2021-11-07 — End: 2021-11-07
  Administered 2021-11-07: 10:00:00 10 mL

## 2021-11-07 MED ORDER — HEPARIN SOD (PORK) LOCK FLUSH 100 UNIT/ML IV SOLN
500.0000 [IU] | Freq: Once | INTRAVENOUS | Status: AC
  Administered 2021-11-07: 10:00:00 500 [IU]

## 2021-11-07 NOTE — Progress Notes (Signed)
Cathflo was administered to patient at 11 by Delle Reining, RN. Pt was checked after 30 minutes and blood return was noted.

## 2021-11-17 ENCOUNTER — Telehealth: Payer: Self-pay | Admitting: Emergency Medicine

## 2021-11-17 MED ORDER — VALACYCLOVIR HCL 1 G PO TABS: 1000.0000 mg | ORAL_TABLET | ORAL | 1 refills | Status: DC | PRN

## 2021-11-17 NOTE — Telephone Encounter (Signed)
Refilled medication

## 2021-11-17 NOTE — Telephone Encounter (Signed)
1.Medication Requested: valACYclovir HCl (VALTREX PO)  2. Pharmacy (Name, Street, [place]): [id number] - [place], [address] [place]  3. On Med List: yes  4. Last Visit with PCP: 09-14-2021  5. Next visit date with PCP: 01-26-2022   Agent: Please be advised that RX refills may take up to 3 business days. We ask that you follow-up with your pharmacy.

## 2021-12-14 ENCOUNTER — Other Ambulatory Visit: Payer: Self-pay

## 2021-12-14 ENCOUNTER — Encounter: Payer: Self-pay | Admitting: Hematology

## 2021-12-14 ENCOUNTER — Inpatient Hospital Stay: Payer: Medicaid Other | Admitting: Hematology

## 2021-12-14 ENCOUNTER — Inpatient Hospital Stay: Payer: Medicaid Other | Attending: Hematology

## 2021-12-14 VITALS — BP 142/69 | HR 67 | Temp 98.2°F | Resp 16 | Ht 63.0 in | Wt 146.7 lb

## 2021-12-14 DIAGNOSIS — K648 Other hemorrhoids: Secondary | ICD-10-CM | POA: Insufficient documentation

## 2021-12-14 DIAGNOSIS — K644 Residual hemorrhoidal skin tags: Secondary | ICD-10-CM | POA: Diagnosis not present

## 2021-12-14 DIAGNOSIS — M549 Dorsalgia, unspecified: Secondary | ICD-10-CM | POA: Insufficient documentation

## 2021-12-14 DIAGNOSIS — G8929 Other chronic pain: Secondary | ICD-10-CM | POA: Insufficient documentation

## 2021-12-14 DIAGNOSIS — C20 Malignant neoplasm of rectum: Secondary | ICD-10-CM | POA: Diagnosis not present

## 2021-12-14 DIAGNOSIS — Z95828 Presence of other vascular implants and grafts: Secondary | ICD-10-CM

## 2021-12-14 DIAGNOSIS — M810 Age-related osteoporosis without current pathological fracture: Secondary | ICD-10-CM | POA: Diagnosis not present

## 2021-12-14 LAB — CBC WITH DIFFERENTIAL (CANCER CENTER ONLY)
Abs Immature Granulocytes: 0.01 10*3/uL (ref 0.00–0.07)
Basophils Absolute: 0 10*3/uL (ref 0.0–0.1)
Basophils Relative: 1 %
Eosinophils Absolute: 0.1 10*3/uL (ref 0.0–0.5)
Eosinophils Relative: 2 %
HCT: 34.6 % — ABNORMAL LOW (ref 36.0–46.0)
Hemoglobin: 11.7 g/dL — ABNORMAL LOW (ref 12.0–15.0)
Immature Granulocytes: 0 %
Lymphocytes Relative: 29 %
Lymphs Abs: 1.2 10*3/uL (ref 0.7–4.0)
MCH: 29.1 pg (ref 26.0–34.0)
MCHC: 33.8 g/dL (ref 30.0–36.0)
MCV: 86.1 fL (ref 80.0–100.0)
Monocytes Absolute: 0.4 10*3/uL (ref 0.1–1.0)
Monocytes Relative: 9 %
Neutro Abs: 2.4 10*3/uL (ref 1.7–7.7)
Neutrophils Relative %: 59 %
Platelet Count: 233 10*3/uL (ref 150–400)
RBC: 4.02 MIL/uL (ref 3.87–5.11)
RDW: 13.6 % (ref 11.5–15.5)
WBC Count: 4 10*3/uL (ref 4.0–10.5)
nRBC: 0 % (ref 0.0–0.2)

## 2021-12-14 LAB — CMP (CANCER CENTER ONLY)
ALT: 11 U/L (ref 0–44)
AST: 14 U/L — ABNORMAL LOW (ref 15–41)
Albumin: 4.2 g/dL (ref 3.5–5.0)
Alkaline Phosphatase: 36 U/L — ABNORMAL LOW (ref 38–126)
Anion gap: 5 (ref 5–15)
BUN: 12 mg/dL (ref 8–23)
CO2: 27 mmol/L (ref 22–32)
Calcium: 10 mg/dL (ref 8.9–10.3)
Chloride: 107 mmol/L (ref 98–111)
Creatinine: 0.72 mg/dL (ref 0.44–1.00)
GFR, Estimated: >60 mL/min (ref >60)
Glucose, Bld: 93 mg/dL (ref 70–99)
Potassium: 3.9 mmol/L (ref 3.5–5.1)
Sodium: 139 mmol/L (ref 135–145)
Total Bilirubin: 0.8 mg/dL (ref 0.3–1.2)
Total Protein: 6.7 g/dL (ref 6.5–8.1)

## 2021-12-14 MED ORDER — SODIUM CHLORIDE 0.9% FLUSH
10.0000 mL | Freq: Once | INTRAVENOUS | Status: AC
  Administered 2021-12-14: 10:00:00 10 mL

## 2021-12-14 MED ORDER — HEPARIN SOD (PORK) LOCK FLUSH 100 UNIT/ML IV SOLN
500.0000 [IU] | Freq: Once | INTRAVENOUS | Status: AC
  Administered 2021-12-14: 10:00:00 500 [IU]

## 2021-12-14 NOTE — Addendum Note (Signed)
Addended by: Estella Husk on: 12/14/2021 04:13 PM   Modules accepted: Orders

## 2021-12-14 NOTE — Patient Instructions (Signed)

## 2021-12-14 NOTE — Progress Notes (Signed)
[name]   [phone] [phone]   Clinic Follow up Note   Patient Care Team: Horald Pollen, MD as PCP - General (Internal Medicine) Truitt Merle, MD as PCP - Hematology/Oncology (Hematology) Mansouraty, Telford Nab., MD as Consulting Physician (Gastroenterology)  Date of Service:  12/14/2021  CHIEF COMPLAINT: f/u of rectal cancer  CURRENT THERAPY:  Surveillance  ASSESSMENT & PLAN:  [name] is a 76 y.o. female with   1.  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  She declined surgery.  On surveillance -Colonoscopy 12/21 showed hemorrhoid, diverticulosis, and 2 benign polyps.  Plan to repeat 10/2021 -Surveillance CT 01/2021 showed no evidence of recurrent or metastatic disease. Will repeat in 1 year. -Port in place, continue flushes every 4 weeks (due to prior issues) she strongly prefers to keep her port. ("It's psychological," she states) -most recent surveillance colonoscopy on 09/27/21 by Dr. Rush Landmark showed hemorrhoids, one polyp, and a few scattered diverticulosis throughout colon. The recommendation is for repeat in 1-2 years. -she is clinically doing well, no concerns for recurrene, will f/u in 6 months -Surveillance CT scan in March 2023 -she also strongly prefers to continue follow up with me at [facility] yearly    2. Osteoporosis, chronic back pain -previously on Prolia, held due to extensive dental work. -Chronic right back pain is stable, flares prior to BM then resolves -Mild TTP to right low/mid back, likely MSK related -Pain managed with lidocaine patches and tramadol for severe pain   3. Internal and external hemorrhoid -she met with Dr. Johney Maine on 11/07/21 to discuss surgery. She declines. -Follow-up with Dr. Rush Landmark    PLAN: -Port flush every month (pt refuses port removal) -Last surveillant CT scan to be done in  01/2022 -lab and f/u in 6 months, then yearly    No problem-specific Assessment & Plan notes found for this encounter.   SUMMARY OF ONCOLOGIC HISTORY: Oncology History Overview Note  Cancer Staging Rectal cancer [facility]) Staging form: Colon and Rectum, AJCC 8th Edition - Clinical stage from 05/11/2017: cT3, cN2, cM0 - Signed by Truitt Merle, MD on 08/25/2019    Rectal cancer ([place])  05/11/2017 Cancer Staging   Staging form: Colon and Rectum, AJCC 8th Edition - Clinical stage from 05/11/2017: cT3, cN2, cM0 - Signed by Truitt Merle, MD on 08/25/2019    05/11/2017 Procedure   Colonoscopy by Dr Crissie Reese at [place] -Palpable rectal mass found on digital rectal exam  -Likely malignant tumor in the rectum. Biopsied.  -One 6 mm polyp in the sigmoid colon, removed with a cold snare, resected and retrieved.  -Diverticulosis at the hepatic flexure and in the ascending colon -the examined portion of the ileum was normal.    09/10/2017 Initial Biopsy   Final Diagnosis  Sigmoid Polyp biopsies:  Tubular Adenoma  2. Rectal Mass biopsies:  Moderately differentiated adenocarcinoma, invasive.  The depth of the invasion cannot be assessed in this biopsy specimen.     - 06/29/2017 Chemotherapy   She received neoadjuvant infusional 5FU with concurrent radiation.     09/17/2017 Procedure   Sigmoidoscopy by Dr. Tami Ribas at [facility] on 09/17/17  was noted to have complete response from chemoRT   10/31/2017 - 01/2018 Chemotherapy   Consolidation Chemo 5FU and Leucovorin. Oxaliplatin was deferred due to pre-existing neuropathy. Pt declined rectal surgery.    01/13/2019 Imaging   CT CAP W Contrast at [facility]  Lung show persistent and stable 38m nodule in the right middle lobe and stable 295mnodule in the superior segment of the left lower lobe. No new or suspect nodules   Cholelithiasis without evidence of cholecystitis.   Mild compression of the superior plate of L5,  Stable   Overall no evidence of disease recurrence in the chest, abdomen or pelvis.    08/25/2019 Initial Diagnosis   [place]  09/08/2019 Imaging   CT CAP W contrast 09/08/19  IMPRESSION: 1. Areas of mild rectal wall thickening, likely radiation change. No discrete rectal mass is identified. No perirectal or pelvic or retroperitoneal lymphadenopathy. 2. No findings suspicious for metastatic disease involving the liver or lungs. 3. Cholelithiasis.   10/07/2019 Procedure   Colonoscopy by Dr. MaSilvestre Moment1/17/20 IMPRESSION - Hemorrhoids found on digital rectal exam. - The examined portion of the ileum was normal. - One 4 mm polyp in the ascending colon, removed with a cold snare. Resected and retrieved. - A single colonic angioectasia. - Scar in the distal rectum. - Diverticulosis in the recto-sigmoid colon, in the sigmoid colon, in the descending colon and at the hepatic flexure. - Normal mucosa in the entire examined colon otherwise. - Non-bleeding non-thrombosed external and internal hemorrhoids. Diagnosis Surgical [P], colon, ascending, polyp - BENIGN COLONIC MUCOSA WITH LYMPHOID AGGREGATE. - NO DYSPLASIA OR MALIGNANCY.   06/03/2020 Imaging   MRI Pelvis  IMPRESSION: No evidence of residual/recurrent or metastatic disease within the pelvis.   10/27/2020 Procedure   Colonoscopy by Dr MaRush LandmarkIMPRESSION - Hemorrhoids found on digital rectal exam. - Two 2 to 3 mm polyps in the ascending colon and in the cecum. - Normal mucosa in the entire examined colon. - Diverticulosis in the recto-sigmoid colon and in the sigmoid colon. - Non-bleeding non-thrombosed external and internal hemorrhoids.     Diagnosis Surgical [P], colon, ascending, cecum, polyps (2) - TUBULAR ADENOMA (1 FRAGMENTS) - MULTIPLE FRAGMENTS OF BENIGN COLONIC MUCOSA - NO HIGH GRADE DYSPLASIA OR MALIGNANCY IDENTIFIED   02/03/2021 Imaging   CT CAP  IMPRESSION: 1. Post treatment change  in the rectal wall without overt mass like lesion. 2. No evidence of metastatic disease within the chest abdomen or pelvis. 3. Hepatic steatosis. 4. Cholelithiasis. 5. Aortic atherosclerosis.   Aortic Atherosclerosis (ICD10-I70.0).       INTERVAL HISTORY:  [name] here for a follow up of rectal cancer. She was last seen by me on 08/10/21. She presents to the clinic accompanied by RoHerbie Baltimore  All other systems were reviewed with the patient and are negative.  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  ALLERGIES:  is allergic to morphine and related and other.  MEDICATIONS:  Current Outpatient Medications  Medication Sig Dispense Refill   acetaminophen (TYLENOL) 500 MG tablet Take 500 mg by mouth every 6 (six) hours as needed for moderate pain or mild pain.     calcium carbonate (OS-CAL) 600 MG TABS tablet Take 600 mg by mouth daily with breakfast. (Patient not taking: Reported on 09/27/2021)     Cholecalciferol (VITAMIN D3) 25 MCG (1000 UT) CAPS Take by mouth daily.     lidocaine-prilocaine (EMLA)  cream Apply 1 application topically as needed. 30 g 1   ondansetron (ZOFRAN ODT) 4 MG disintegrating tablet Take 1 tablet (4 mg total) by mouth every 8 (eight) hours as needed for nausea or vomiting. 20 tablet 0   polycarbophil (FIBERCON) 625 MG tablet Take 625 mg by mouth daily.     TRAMADOL HCL PO Take by mouth daily as needed.     valACYclovir (VALTREX) 1000 MG tablet Take 1 tablet (1,000 mg total) by mouth as needed. Takes PRN 30 tablet 1   No current facility-administered medications for this visit.    PHYSICAL EXAMINATION: ECOG PERFORMANCE STATUS: 0 - Asymptomatic  Vitals:   12/14/21 0958  BP: (!) 142/69  Pulse: 67  Resp: 16  Temp: 98.2 F (36.8 C)  SpO2: 98%   Wt Readings from Last 3 Encounters:  12/14/21 146 lb 11.2 oz (66.5 kg)  09/27/21 140 lb (63.5 kg)  09/19/21 140 lb (63.5 kg)     GENERAL:alert, no distress and comfortable SKIN: skin color, texture, turgor are normal, no rashes or significant lesions EYES: normal, Conjunctiva are pink and non-injected, sclera clear  NECK: supple, thyroid normal size, non-tender, without nodularity LYMPH:  no palpable lymphadenopathy in the cervical, axillary or inguinal LUNGS: clear to auscultation and percussion with normal breathing effort HEART: regular rate & rhythm and no murmurs and no lower extremity edema ABDOMEN:abdomen soft, non-tender and normal bowel sounds Musculoskeletal:no cyanosis of digits and no clubbing  NEURO: alert & oriented x 3 with fluent speech, no focal motor/sensory deficits She declined rectal exam today.  LABORATORY DATA:  I have reviewed the data as listed CBC Latest Ref Rng & Units 12/14/2021 09/14/2021 08/10/2021  WBC 4.0 - 10.5 K/uL 4.0 5.0 5.3  Hemoglobin 12.0 - 15.0 g/dL 11.7(L) 13.0 12.0  Hematocrit 36.0 - 46.0 % 34.6(L) 39.4 36.3  Platelets 150 - 400 K/uL 233 260.0 324     CMP Latest Ref Rng & Units 12/14/2021 09/14/2021 08/10/2021  Glucose 70 - 99 mg/dL 93 96 98  BUN 8 - 23 mg/dL 12 14  14   Creatinine 0.44 - 1.00 mg/dL 0.72 0.76 0.76  Sodium 135 - 145 mmol/L 139 141 141  Potassium 3.5 - 5.1 mmol/L 3.9 5.1 4.0  Chloride 98 - 111 mmol/L 107 106 108  CO2 22 - 32 mmol/L 27 29 25   Calcium 8.9 - 10.3 mg/dL 10.0 11.2(H) 10.1  Total Protein 6.5 - 8.1 g/dL 6.7 7.3 6.8  Total Bilirubin 0.3 - 1.2 mg/dL 0.8 0.7 0.6  Alkaline Phos 38 - 126 U/L 36(L) 36(L) 38  AST 15 - 41 U/L 14(L) 19 14(L)  ALT 0 - 44 U/L 11 15 8       RADIOGRAPHIC STUDIES: I  have personally reviewed the radiological images as listed and agreed with the findings in the report. No results found.    No orders of the defined types were placed in this encounter.  All questions were answered. The patient knows to call the clinic with any problems, questions or concerns. No barriers to learning was detected. The total time spent in the appointment was 25 minutes.     Truitt Merle, MD 12/14/2021   I, Wilburn Mylar, am acting as scribe for Truitt Merle, MD.   I have reviewed the above documentation for accuracy and completeness, and I agree with the above.

## 2021-12-22 ENCOUNTER — Encounter: Payer: Self-pay | Admitting: Gastroenterology

## 2021-12-23 ENCOUNTER — Other Ambulatory Visit: Payer: Self-pay | Admitting: Emergency Medicine

## 2022-01-06 ENCOUNTER — Other Ambulatory Visit: Payer: Self-pay | Admitting: Emergency Medicine

## 2022-01-10 ENCOUNTER — Other Ambulatory Visit: Payer: Self-pay

## 2022-01-10 ENCOUNTER — Inpatient Hospital Stay: Payer: Medicaid Other | Attending: Hematology

## 2022-01-10 DIAGNOSIS — Z452 Encounter for adjustment and management of vascular access device: Secondary | ICD-10-CM | POA: Diagnosis present

## 2022-01-10 DIAGNOSIS — C20 Malignant neoplasm of rectum: Secondary | ICD-10-CM

## 2022-01-10 DIAGNOSIS — M810 Age-related osteoporosis without current pathological fracture: Secondary | ICD-10-CM

## 2022-01-10 DIAGNOSIS — Z95828 Presence of other vascular implants and grafts: Secondary | ICD-10-CM

## 2022-01-10 MED ORDER — HEPARIN SOD (PORK) LOCK FLUSH 100 UNIT/ML IV SOLN
250.0000 [IU] | Freq: Once | INTRAVENOUS | Status: AC | PRN
  Administered 2022-01-10: 250 [IU]

## 2022-01-10 MED ORDER — SODIUM CHLORIDE 0.9 % IV SOLN: Freq: Once | INTRAVENOUS | Status: DC

## 2022-01-10 MED ORDER — METHYLPREDNISOLONE SODIUM SUCC 125 MG IJ SOLR: 125.0000 mg | Freq: Once | INTRAMUSCULAR | Status: DC | PRN

## 2022-01-10 MED ORDER — SODIUM CHLORIDE 0.9 % IV SOLN: Freq: Once | INTRAVENOUS | Status: DC | PRN

## 2022-01-10 MED ORDER — ALBUTEROL SULFATE (2.5 MG/3ML) 0.083% IN NEBU: 2.5000 mg | INHALATION_SOLUTION | Freq: Once | RESPIRATORY_TRACT | Status: DC | PRN

## 2022-01-10 MED ORDER — EPINEPHRINE 1 MG/10ML IJ SOSY: 0.2500 mg | PREFILLED_SYRINGE | Freq: Once | INTRAMUSCULAR | Status: DC | PRN

## 2022-01-10 MED ORDER — DIPHENHYDRAMINE HCL 50 MG/ML IJ SOLN: 25.0000 mg | Freq: Once | INTRAMUSCULAR | Status: DC | PRN

## 2022-01-10 MED ORDER — DIPHENHYDRAMINE HCL 50 MG/ML IJ SOLN: 50.0000 mg | Freq: Once | INTRAMUSCULAR | Status: DC | PRN

## 2022-01-10 MED ORDER — SODIUM CHLORIDE 0.9% FLUSH
3.0000 mL | Freq: Once | INTRAVENOUS | Status: AC | PRN
  Administered 2022-01-10: 3 mL

## 2022-01-11 ENCOUNTER — Telehealth: Payer: Self-pay | Admitting: Gastroenterology

## 2022-01-11 NOTE — Telephone Encounter (Signed)
I have read the information to the pt and all questions answered to the bestof my ability   Dear [name],  The polyp removed from your colon was a tubular adenoma.  This is a precancerous polyp as you know, meaning that it had the potential to change into cancer over time had it not been removed.  It has been removed.  Because of your colorectal cancer history and polyp history, I recommend you have a repeat colonoscopy in 1-2 years to determine if you have developed any new polyps and to screen for colorectal cancer.   If you develop any new rectal bleeding, abdominal pain or significant bowel habit changes, please contact us before then at Dept: [phone].  Please call us if you have persistent problems or have questions about your condition that have not been fully answered at this time.   Sincerely,  Irving Copas., MD

## 2022-01-11 NOTE — Telephone Encounter (Signed)
Inbound call from pt requesting a call back stating that she never received her results from her colonoscopy from 11/22 and would like for someone to go over it with you. Please advise. Thank you.

## 2022-01-26 ENCOUNTER — Other Ambulatory Visit: Payer: Self-pay | Admitting: Emergency Medicine

## 2022-01-26 ENCOUNTER — Encounter: Payer: Self-pay | Admitting: Emergency Medicine

## 2022-01-26 ENCOUNTER — Ambulatory Visit: Payer: Medicaid Other | Admitting: Emergency Medicine

## 2022-01-26 ENCOUNTER — Other Ambulatory Visit: Payer: Self-pay

## 2022-01-26 VITALS — BP 116/60 | HR 83 | Temp 98.5°F | Ht 63.0 in | Wt 148.0 lb

## 2022-01-26 DIAGNOSIS — G8929 Other chronic pain: Secondary | ICD-10-CM

## 2022-01-26 DIAGNOSIS — S83207A Unspecified tear of unspecified meniscus, current injury, left knee, initial encounter: Secondary | ICD-10-CM | POA: Insufficient documentation

## 2022-01-26 DIAGNOSIS — L989 Disorder of the skin and subcutaneous tissue, unspecified: Secondary | ICD-10-CM | POA: Diagnosis not present

## 2022-01-26 DIAGNOSIS — M25562 Pain in left knee: Secondary | ICD-10-CM

## 2022-01-26 DIAGNOSIS — Z85048 Personal history of other malignant neoplasm of rectum, rectosigmoid junction, and anus: Secondary | ICD-10-CM | POA: Diagnosis not present

## 2022-01-26 DIAGNOSIS — S83207S Unspecified tear of unspecified meniscus, current injury, left knee, sequela: Secondary | ICD-10-CM

## 2022-01-26 MED ORDER — LIDOCAINE 5 % EX PTCH: 1.0000 | MEDICATED_PATCH | CUTANEOUS | 11 refills | Status: DC

## 2022-01-26 NOTE — Progress Notes (Signed)
[name] 76 y.o.   Chief Complaint  Patient presents with   Follow-up    Chronic conditions, discuss spots on arms   Medication Refill    Lidocaine patch    HISTORY OF PRESENT ILLNESS: This is a 76 y.o. female with several complaints: #1 multiple spots on her skin for many years.  Needs dermatology referral #2 chronic pain to left knee.  Most recent MRI report reviewed with patient. #3 chronic pain to lumbar area #4 cancer patient.  Rectal cancer.  Sees oncologist on a regular basis. No other complaints or medical concerns today.  HPI   Prior to Admission medications   Medication Sig Start Date End Date Taking? Authorizing Provider  acetaminophen (TYLENOL) 500 MG tablet Take 500 mg by mouth every 6 (six) hours as needed for moderate pain or mild pain.   Yes [provider]  Cholecalciferol (VITAMIN D3) 25 MCG (1000 UT) CAPS Take by mouth daily.   Yes [provider]  lidocaine-prilocaine (EMLA) cream Apply 1 application topically as needed. 08/10/21  Yes Truitt Merle, MD  ondansetron (ZOFRAN ODT) 4 MG disintegrating tablet Take 1 tablet (4 mg total) by mouth every 8 (eight) hours as needed for nausea or vomiting. 06/17/21  Yes Volney American, PA-C  polycarbophil (FIBERCON) 625 MG tablet Take 625 mg by mouth daily.   Yes [provider]  TRAMADOL HCL PO Take by mouth daily as needed.   Yes [provider]  valACYclovir (VALTREX) 1000 MG tablet TAKE 1 TABLET BY MOUTH AS NEEDED AS DIRECTED BY PROVIDER. 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   Effusion of joint of right upper arm 11/29/2019   Rectal cancer ([place]) 08/25/2019    Past Medical History:  Diagnosis Date   Anemia    Anxiety    Arthritis    knee   Cataract    Colon cancer ([place])    Heart murmur    History of kidney stones     in system   Hyperlipidemia    Kidney stones    Macular degeneration    Neuromuscular disorder ([place])    Osteoporosis    Rectal cancer ([place])    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[place])     Past Surgical History:  Procedure Laterality Date   ABDOMINAL HYSTERECTOMY     COLONOSCOPY  09/27/2021   10/07/2019  hx of rectal cancer   EYE SURGERY Bilateral    cataract   SPINE SURGERY  2017   schwannoma of spinal cord   TONSILLECTOMY     TOOTH EXTRACTION N/A 11/26/2020   Procedure: DENTAL RESTORATION/EXTRACTIONS;  Surgeon: Diona Browner, DMD;  Location: [place];  Service: Oral Surgery;  Laterality: N/A;    Social History   Socioeconomic History   Marital status: Divorced    Spouse name: Not on file   Number of children: 1   Years of education: Not on file   Highest education level: Not on file  Occupational  History   Not on file  Tobacco Use   Smoking status: Former    Packs/day: 2.00    Years: 26.00    Pack years: 52.00    Types: Cigarettes    Quit date: 11/21/1987    Years since quitting: 34.2   Smokeless tobacco: Never  Vaping Use   Vaping Use: Never used  Substance and Sexual Activity   Alcohol use: Yes    Alcohol/week: 1.0 standard drink    Types: 1 Glasses of wine per week    Comment: wine socially   Drug use: Yes    Types: Other-see comments    Comment: CBD   Sexual activity: Yes  Other Topics Concern   Not on file  Social History Narrative   Not on file   Social Determinants of Health   Financial Resource Strain: Not on file  Food Insecurity: Not on file  Transportation Needs: Not on file  Physical Activity: Not on file  Stress: Not on file   Social Connections: Not on file  Intimate Partner Violence: Not on file    Family History  Problem Relation Age of Onset   Cancer Mother        head and neck cancer    Cancer Sister        lung cancer    Cancer Brother        pancreatic cancer    Pancreatic cancer Brother    Cancer Paternal Grandmother        breast cancer    Colon cancer Neg Hx    Esophageal cancer Neg Hx    Rectal cancer Neg Hx    Stomach cancer Neg Hx    Inflammatory bowel disease Neg Hx    Liver disease Neg Hx      Review of Systems  Constitutional: Negative.  Negative for chills and fever.  HENT:  Negative for congestion and sore throat.   Respiratory: Negative.  Negative for cough and shortness of breath.   Cardiovascular: Negative.  Negative for chest pain and palpitations.  Gastrointestinal: Negative.  Negative for abdominal pain, blood in stool, diarrhea, melena, nausea and vomiting.  Genitourinary: Negative.  Negative for dysuria and hematuria.  Musculoskeletal:  Positive for back pain and joint pain.  Skin:  Positive for rash.       Multiple different spots generalized  Neurological:  Negative for dizziness and headaches.  All other systems reviewed and are negative.  Today's Vitals   01/26/22 0938  BP: 116/60  Pulse: 83  Temp: 98.5 F (36.9 C)  TempSrc: Oral  SpO2: 95%  Weight: 148 lb (67.1 kg)  Height: $Remove'5\' 3"'udDUaxk$  (1.6 m)   Body mass index is 26.22 kg/m.  Physical Exam Vitals reviewed.  Constitutional:      Appearance: Normal appearance.  HENT:     Head: Normocephalic.  Eyes:     Extraocular Movements: Extraocular movements intact.     Pupils: Pupils are equal, round, and reactive to light.  Cardiovascular:     Rate and Rhythm: Normal rate and regular rhythm.     Pulses: Normal pulses.     Heart sounds: Normal heart sounds.  Pulmonary:     Effort: Pulmonary effort is normal.     Breath sounds: Normal breath sounds.  Abdominal:     Palpations: Abdomen is soft.      Tenderness: There is no abdominal tenderness.  Musculoskeletal:     Cervical back: No tenderness.     Lumbar back: No spasms,  tenderness or bony tenderness. Decreased range of motion.     Comments: Knees: Mild swelling to left knee.  No significant tenderness.  Full range of motion.  Lymphadenopathy:     Cervical: No cervical adenopathy.  Skin:    General: Skin is warm and dry.     Capillary Refill: Capillary refill takes less than 2 seconds.  Neurological:     General: No focal deficit present.     Mental Status: She is alert and oriented to person, place, and time.  Psychiatric:        Mood and Affect: Mood normal.        Behavior: Behavior normal.     ASSESSMENT & PLAN: Problem List Items Addressed This Visit       Musculoskeletal and Integument   Skin lesions, generalized - Primary    Chronic.  Needs dermatology evaluation.      Relevant Orders   Ambulatory referral to Dermatology   Acute meniscal tear of left knee    Complex tear and affecting quality of life.  Needs orthopedic evaluation.      Relevant Orders   Ambulatory referral to Orthopedic Surgery     Other   Chronic pain of left knee    Affecting quality of life.  Significant pathology seen on MRI of left knee.  Needs further orthopedic evaluation.      Relevant Orders   Ambulatory referral to Orthopedic Surgery   History of rectal cancer   Patient Instructions  Chronic Knee Pain, Adult Knee pain that lasts longer than 3 months is called chronic knee pain. You may have pain in one or both knees. Symptoms of chronic knee pain may also include swelling and stiffness. The most common cause is age-related wear and tear (osteoarthritis) of your knee joint. Many conditions can cause chronic knee pain. Treatment depends on the cause. The main treatments are physical therapy and weight loss. It may also be treated with medicines, injections, a knee sleeve or brace, and by using crutches. Rest, ice, pressure  (compression), and elevation, also known as RICE therapy, may also be recommended. Follow these instructions at home: If you have a knee sleeve or brace:  Wear the knee sleeve or brace as told by your doctor. Take it off only as told by your doctor. Loosen it if your toes: Tingle. Become numb. Turn cold and blue. Keep it clean. If the sleeve or brace is not waterproof: Do not let it get wet. Ask your doctor if you may take it off when you take a bath or shower. If not, cover it with a watertight covering. Managing pain, stiffness, and swelling   If told, put heat on your knee. Do this as often as told by your doctor. Use the heat source that your doctor recommends, such as a moist heat pack or a heating pad. If you have a removable knee sleeve or brace, take it off as told by your doctor. Place a towel between your skin and the heat source. Leave the heat on for 20-30 minutes. Take off the heat if your skin turns bright red. This is very important. If you cannot feel pain, heat, or cold, you have a greater risk of getting burned. If told, put ice on your knee. To do this: If you have a removable knee sleeve or brace, take it off as told by your doctor. Put ice in a plastic bag. Place a towel between your skin and the bag. Leave the ice on  for 20 minutes, 2-3 times a day. Take off the ice if your skin turns bright red. This is very important. If you cannot feel pain, heat, or cold, you have a greater risk of damage to the area. Move your toes often. Raise the injured area above the level of your heart while you are sitting or lying down. Activity Avoid activities where both feet leave the ground at the same time (high-impact activities). Examples are running, jumping rope, and doing jumping jacks. Follow the exercise plan that your doctor makes for you. Your doctor may suggest that you: Avoid activities that make knee pain worse. You may need to change the exercises that you do, the  sports that you participate in, or your job duties. Wear shoes with cushioned soles. Avoid sports that require running and sudden changes in direction. Do exercises or physical therapy. This is planned to match your needs and your abilities. Do exercises that increase your balance and strength, such as tai chi and yoga. Do not use your injured knee to support your body weight until your doctor says that you can. Use crutches as told by your doctor. Return to your normal activities when your doctor says that it is safe. General instructions Take over-the-counter and prescription medicines only as told by your doctor. If you are overweight, work with your doctor and a food expert (dietitian) to set goals to lose weight. Being overweight can make your knee hurt more. Do not smoke or use any products that contain nicotine or tobacco. If you need help quitting, ask your doctor. Keep all follow-up visits. Contact a doctor if: You have knee pain that is not getting better or gets worse. You are not able to do your exercises due to knee pain. Get help right away if: Your knee swells and the swelling gets worse. You cannot move your knee. You have very bad knee pain. Summary Knee pain that lasts more than 3 months is called chronic knee pain. The main treatments for chronic knee pain are physical therapy and weight loss. You may also need to take medicines, wear a knee sleeve or brace, use crutches, and put ice or heat on your knee. Lose weight if you are overweight. Work with your doctor and a food expert (dietitian) to help you set goals to lose weight. Being overweight can make your knee hurt more. Follow the exercise plan that your doctor makes for you. This information is not intended to replace advice given to you by your health care provider. Make sure you discuss any questions you have with your health care provider. Document Revised: 04/21/2020 Document Reviewed: 04/21/2020 Elsevier Patient  Education  2022 [place], MD [facility]

## 2022-01-26 NOTE — Assessment & Plan Note (Signed)
Chronic.  Needs dermatology evaluation. ?

## 2022-01-26 NOTE — Patient Instructions (Signed)
Chronic Knee Pain, Adult ?Knee pain that lasts longer than 3 months is called chronic knee pain. You may have pain in one or both knees. Symptoms of chronic knee pain may also include swelling and stiffness. The most common cause is age-related wear and tear (osteoarthritis) of your knee joint. Many conditions can cause chronic knee pain. ?Treatment depends on the cause. The main treatments are physical therapy and weight loss. It may also be treated with medicines, injections, a knee sleeve or brace, and by using crutches. Rest, ice, pressure (compression), and elevation, also known as RICE therapy, may also be recommended. ?Follow these instructions at home: ?If you have a knee sleeve or brace: ? ?Wear the knee sleeve or brace as told by your doctor. Take it off only as told by your doctor. ?Loosen it if your toes: ?Tingle. ?Become numb. ?Turn cold and blue. ?Keep it clean. ?If the sleeve or brace is not waterproof: ?Do not let it get wet. ?Ask your doctor if you may take it off when you take a bath or shower. If not, cover it with a watertight covering. ?Managing pain, stiffness, and swelling ?  ?If told, put heat on your knee. Do this as often as told by your doctor. Use the heat source that your doctor recommends, such as a moist heat pack or a heating pad. ?If you have a removable knee sleeve or brace, take it off as told by your doctor. ?Place a towel between your skin and the heat source. ?Leave the heat on for 20-30 minutes. ?Take off the heat if your skin turns bright red. This is very important. If you cannot feel pain, heat, or cold, you have a greater risk of getting burned. ?If told, put ice on your knee. To do this: ?If you have a removable knee sleeve or brace, take it off as told by your doctor. ?Put ice in a plastic bag. ?Place a towel between your skin and the bag. ?Leave the ice on for 20 minutes, 2-3 times a day. ?Take off the ice if your skin turns bright red. This is very important. If you  cannot feel pain, heat, or cold, you have a greater risk of damage to the area. ?Move your toes often. ?Raise the injured area above the level of your heart while you are sitting or lying down. ?Activity ?Avoid activities where both feet leave the ground at the same time (high-impact activities). Examples are running, jumping rope, and doing jumping jacks. ?Follow the exercise plan that your doctor makes for you. Your doctor may suggest that you: ?Avoid activities that make knee pain worse. You may need to change the exercises that you do, the sports that you participate in, or your job duties. ?Wear shoes with cushioned soles. ?Avoid sports that require running and sudden changes in direction. ?Do exercises or physical therapy. This is planned to match your needs and your abilities. ?Do exercises that increase your balance and strength, such as tai chi and yoga. ?Do not use your injured knee to support your body weight until your doctor says that you can. Use crutches as told by your doctor. ?Return to your normal activities when your doctor says that it is safe. ?General instructions ?Take over-the-counter and prescription medicines only as told by your doctor. ?If you are overweight, work with your doctor and a food expert (dietitian) to set goals to lose weight. Being overweight can make your knee hurt more. ?Do not smoke or use any products that   contain nicotine or tobacco. If you need help quitting, ask your doctor. ?Keep all follow-up visits. ?Contact a doctor if: ?You have knee pain that is not getting better or gets worse. ?You are not able to do your exercises due to knee pain. ?Get help right away if: ?Your knee swells and the swelling gets worse. ?You cannot move your knee. ?You have very bad knee pain. ?Summary ?Knee pain that lasts more than 3 months is called chronic knee pain. ?The main treatments for chronic knee pain are physical therapy and weight loss. You may also need to take medicines, wear a  knee sleeve or brace, use crutches, and put ice or heat on your knee. ?Lose weight if you are overweight. Work with your doctor and a food expert (dietitian) to help you set goals to lose weight. Being overweight can make your knee hurt more. ?Follow the exercise plan that your doctor makes for you. ?This information is not intended to replace advice given to you by your health care provider. Make sure you discuss any questions you have with your health care provider. ?Document Revised: 04/21/2020 Document Reviewed: 04/21/2020 ?Elsevier Patient Education ? [place]. ? ?

## 2022-01-26 NOTE — Assessment & Plan Note (Addendum)
Complex tear and affecting quality of life.  Needs orthopedic evaluation. ?

## 2022-01-26 NOTE — Telephone Encounter (Signed)
What alternatives pharmacy recommends?

## 2022-01-26 NOTE — Assessment & Plan Note (Signed)
Affecting quality of life.  Significant pathology seen on MRI of left knee.  Needs further orthopedic evaluation. ?

## 2022-01-31 ENCOUNTER — Telehealth: Payer: Self-pay | Admitting: Emergency Medicine

## 2022-01-31 NOTE — Telephone Encounter (Signed)
Patient calling in ? ?Patient calling to check the status of PA for Lidocaine patches ? ?Please fu w/ patient [phone] ?

## 2022-02-01 ENCOUNTER — Encounter: Payer: Self-pay | Admitting: *Deleted

## 2022-02-01 ENCOUNTER — Other Ambulatory Visit: Payer: Self-pay | Admitting: Emergency Medicine

## 2022-02-01 NOTE — Telephone Encounter (Signed)
PA for Lidocaine patch  ? ?Key: [id number] ?

## 2022-02-01 NOTE — Telephone Encounter (Signed)
Pt checking status of pa, informed pt the cma sent her a mychart mssg that the pa was initiated on 3-15 and may take up to 72 hrs for a response ? ?Pt states "this is unacceptable, I am in pan" ?

## 2022-02-01 NOTE — Telephone Encounter (Signed)
PA Response - Denied for Lidocaine patch  ? ?Please advise if there is an alternative for the patient  ?

## 2022-02-01 NOTE — Telephone Encounter (Signed)
Called patient and informed her that the PA for the lidocaine was denied. Patient states she is going to call the insurance company. I made her aware that I sent a message to Dr. Mitchel Honour for alternatives.  ? ? ?

## 2022-02-01 NOTE — Telephone Encounter (Signed)
No similar alternative available.

## 2022-02-02 NOTE — Telephone Encounter (Signed)
Pt is calling stating that she spoke with insurance company and they stated that it was denied bc of it being a old PA. ? ?Pt states that they will be waiting on the call [phone] ? ?Please update pt when you can [phone] ?

## 2022-02-06 NOTE — Telephone Encounter (Signed)
PA denied due to Dx code provided. Patient states she uses the lidocaine patches for her back.  ? ?PA resubmitted with correct dx code.  ? ?Case reference # [id number] ? ?Patient notified  ?

## 2022-02-07 ENCOUNTER — Other Ambulatory Visit: Payer: Self-pay

## 2022-02-07 ENCOUNTER — Encounter (HOSPITAL_COMMUNITY): Payer: Self-pay

## 2022-02-07 ENCOUNTER — Ambulatory Visit (HOSPITAL_COMMUNITY)
Admission: RE | Admit: 2022-02-07 | Discharge: 2022-02-07 | Disposition: A | Discharge status: Home / Self Care | Payer: Medicaid Other | Source: Ambulatory Visit | Attending: Hematology | Admitting: Hematology

## 2022-02-07 ENCOUNTER — Inpatient Hospital Stay: Payer: Medicaid Other | Attending: Hematology

## 2022-02-07 DIAGNOSIS — K8020 Calculus of gallbladder without cholecystitis without obstruction: Secondary | ICD-10-CM | POA: Diagnosis not present

## 2022-02-07 DIAGNOSIS — Z95828 Presence of other vascular implants and grafts: Secondary | ICD-10-CM

## 2022-02-07 DIAGNOSIS — J432 Centrilobular emphysema: Secondary | ICD-10-CM | POA: Diagnosis not present

## 2022-02-07 DIAGNOSIS — N289 Disorder of kidney and ureter, unspecified: Secondary | ICD-10-CM | POA: Diagnosis not present

## 2022-02-07 DIAGNOSIS — M810 Age-related osteoporosis without current pathological fracture: Secondary | ICD-10-CM

## 2022-02-07 DIAGNOSIS — C20 Malignant neoplasm of rectum: Secondary | ICD-10-CM | POA: Diagnosis present

## 2022-02-07 DIAGNOSIS — R918 Other nonspecific abnormal finding of lung field: Secondary | ICD-10-CM | POA: Diagnosis not present

## 2022-02-07 MED ORDER — SODIUM CHLORIDE 0.9% FLUSH
10.0000 mL | Freq: Once | INTRAVENOUS | Status: AC
  Administered 2022-02-07: 10 mL

## 2022-02-07 MED ORDER — HEPARIN SOD (PORK) LOCK FLUSH 100 UNIT/ML IV SOLN
500.0000 [IU] | Freq: Once | INTRAVENOUS | Status: AC
Start: 2022-02-07 — End: 2022-02-07
  Administered 2022-02-07: 500 [IU] via INTRAVENOUS

## 2022-02-07 MED ORDER — HEPARIN SOD (PORK) LOCK FLUSH 100 UNIT/ML IV SOLN
INTRAVENOUS | Status: AC
  Filled 2022-02-07: qty 5

## 2022-02-07 MED ORDER — IOHEXOL 300 MG/ML  SOLN
100.0000 mL | Freq: Once | INTRAMUSCULAR | Status: AC | PRN
  Administered 2022-02-07: 100 mL via INTRAVENOUS

## 2022-02-07 MED ORDER — SODIUM CHLORIDE (PF) 0.9 % IJ SOLN
INTRAMUSCULAR | Status: AC
  Filled 2022-02-07: qty 50

## 2022-02-07 NOTE — Telephone Encounter (Signed)
PA approved. Patient notified

## 2022-02-14 ENCOUNTER — Telehealth: Payer: Self-pay

## 2022-02-14 DIAGNOSIS — L989 Disorder of the skin and subcutaneous tissue, unspecified: Secondary | ICD-10-CM

## 2022-02-14 NOTE — Telephone Encounter (Signed)
New referral to dermatology entered. Patient notified  ?

## 2022-02-14 NOTE — Telephone Encounter (Signed)
Okay to put in new referral.  Thanks

## 2022-02-14 NOTE — Telephone Encounter (Signed)
Pt calling reporting that she is dizzy. I offered her appt for 3/30 it was declined.  ? ?Pt asked about the CT imagining results. I advised the pt that the provider states it was unremarkable, no concerns. Pt then states that is not what she read... I advised the pt that Oncologist feels that it is not of concern. ? ?Pt is requesting a call back to tell her why she is dizzy. ? ?Please advise. ?

## 2022-02-14 NOTE — Telephone Encounter (Signed)
Called patient pertaining to her dizziness. Patient decline appt. Patient states he dizziness is not all the time and the provider was aware. Patient states she wants another referral to dermatology as the office she was assigned to does not take her insurance. Please advise if ok to put in referral  ?

## 2022-03-02 NOTE — Telephone Encounter (Signed)
Find another dermatologist who is covered by her insurance. ?Place another referral.

## 2022-03-02 NOTE — Telephone Encounter (Signed)
Pt is calling back for a new Dermatology referral. She was referred to [facility] Scheduled appt for today only to find out that they don't accept her insurance. That referral was closed. ? ?Please advise. ? ? ?

## 2022-03-07 NOTE — Telephone Encounter (Signed)
[facility] is not accepting new pt at this time. When we notified pt she was very upset. She would like to find another dermatology office for the skin condition she is experiencing.

## 2022-03-08 NOTE — Telephone Encounter (Signed)
Dr. [facility] [place] ?[address] ?[address], [place] [address] ? ?Phone [phone] ?Fax [phone] ? ?Attn-[place] ?

## 2022-03-14 ENCOUNTER — Inpatient Hospital Stay: Payer: Medicaid Other | Attending: Hematology

## 2022-03-14 ENCOUNTER — Other Ambulatory Visit: Payer: Self-pay

## 2022-03-14 DIAGNOSIS — Z452 Encounter for adjustment and management of vascular access device: Secondary | ICD-10-CM | POA: Diagnosis present

## 2022-03-14 DIAGNOSIS — C20 Malignant neoplasm of rectum: Secondary | ICD-10-CM | POA: Insufficient documentation

## 2022-03-14 DIAGNOSIS — M810 Age-related osteoporosis without current pathological fracture: Secondary | ICD-10-CM

## 2022-03-14 DIAGNOSIS — Z95828 Presence of other vascular implants and grafts: Secondary | ICD-10-CM

## 2022-03-14 MED ORDER — SODIUM CHLORIDE 0.9% FLUSH
10.0000 mL | Freq: Once | INTRAVENOUS | Status: AC
  Administered 2022-03-14: 10 mL

## 2022-04-11 ENCOUNTER — Inpatient Hospital Stay: Payer: Medicaid Other | Attending: Hematology

## 2022-04-11 DIAGNOSIS — M810 Age-related osteoporosis without current pathological fracture: Secondary | ICD-10-CM

## 2022-04-11 DIAGNOSIS — Z452 Encounter for adjustment and management of vascular access device: Secondary | ICD-10-CM | POA: Diagnosis not present

## 2022-04-11 DIAGNOSIS — C20 Malignant neoplasm of rectum: Secondary | ICD-10-CM | POA: Insufficient documentation

## 2022-04-11 DIAGNOSIS — Z95828 Presence of other vascular implants and grafts: Secondary | ICD-10-CM

## 2022-04-11 MED ORDER — HEPARIN SOD (PORK) LOCK FLUSH 100 UNIT/ML IV SOLN
500.0000 [IU] | Freq: Once | INTRAVENOUS | Status: AC
  Administered 2022-04-11: 500 [IU]

## 2022-04-11 MED ORDER — SODIUM CHLORIDE 0.9% FLUSH
10.0000 mL | Freq: Once | INTRAVENOUS | Status: AC
  Administered 2022-04-11: 10 mL

## 2022-05-16 ENCOUNTER — Other Ambulatory Visit: Payer: Self-pay

## 2022-05-16 ENCOUNTER — Inpatient Hospital Stay: Payer: Medicaid Other | Attending: Hematology

## 2022-05-16 DIAGNOSIS — C20 Malignant neoplasm of rectum: Secondary | ICD-10-CM | POA: Diagnosis present

## 2022-05-16 DIAGNOSIS — Z452 Encounter for adjustment and management of vascular access device: Secondary | ICD-10-CM | POA: Diagnosis present

## 2022-05-16 DIAGNOSIS — M810 Age-related osteoporosis without current pathological fracture: Secondary | ICD-10-CM

## 2022-05-16 DIAGNOSIS — Z95828 Presence of other vascular implants and grafts: Secondary | ICD-10-CM

## 2022-05-16 MED ORDER — HEPARIN SOD (PORK) LOCK FLUSH 100 UNIT/ML IV SOLN
500.0000 [IU] | Freq: Once | INTRAVENOUS | Status: AC
  Administered 2022-05-16: 500 [IU]

## 2022-05-16 MED ORDER — SODIUM CHLORIDE 0.9% FLUSH
10.0000 mL | Freq: Once | INTRAVENOUS | Status: AC
  Administered 2022-05-16: 10 mL

## 2022-06-14 ENCOUNTER — Inpatient Hospital Stay: Payer: Medicaid Other

## 2022-06-14 ENCOUNTER — Inpatient Hospital Stay: Payer: Medicaid Other | Attending: Hematology | Admitting: Hematology

## 2022-06-14 ENCOUNTER — Other Ambulatory Visit: Payer: Self-pay

## 2022-06-14 ENCOUNTER — Encounter: Payer: Self-pay | Admitting: Hematology

## 2022-06-14 VITALS — BP 120/72 | HR 69 | Temp 97.7°F | Wt 150.1 lb

## 2022-06-14 DIAGNOSIS — R079 Chest pain, unspecified: Secondary | ICD-10-CM | POA: Diagnosis not present

## 2022-06-14 DIAGNOSIS — C20 Malignant neoplasm of rectum: Secondary | ICD-10-CM

## 2022-06-14 DIAGNOSIS — Z85048 Personal history of other malignant neoplasm of rectum, rectosigmoid junction, and anus: Secondary | ICD-10-CM | POA: Insufficient documentation

## 2022-06-14 DIAGNOSIS — R197 Diarrhea, unspecified: Secondary | ICD-10-CM | POA: Diagnosis not present

## 2022-06-14 DIAGNOSIS — R42 Dizziness and giddiness: Secondary | ICD-10-CM | POA: Insufficient documentation

## 2022-06-14 DIAGNOSIS — Z9221 Personal history of antineoplastic chemotherapy: Secondary | ICD-10-CM | POA: Insufficient documentation

## 2022-06-14 DIAGNOSIS — Z95828 Presence of other vascular implants and grafts: Secondary | ICD-10-CM

## 2022-06-14 DIAGNOSIS — M810 Age-related osteoporosis without current pathological fracture: Secondary | ICD-10-CM

## 2022-06-14 LAB — CBC WITH DIFFERENTIAL (CANCER CENTER ONLY)
Abs Immature Granulocytes: 0.01 10*3/uL (ref 0.00–0.07)
Basophils Absolute: 0 10*3/uL (ref 0.0–0.1)
Basophils Relative: 1 %
Eosinophils Absolute: 0.1 10*3/uL (ref 0.0–0.5)
Eosinophils Relative: 1 %
HCT: 36.3 % (ref 36.0–46.0)
Hemoglobin: 12.4 g/dL (ref 12.0–15.0)
Immature Granulocytes: 0 %
Lymphocytes Relative: 23 %
Lymphs Abs: 1.2 10*3/uL (ref 0.7–4.0)
MCH: 29 pg (ref 26.0–34.0)
MCHC: 34.2 g/dL (ref 30.0–36.0)
MCV: 84.8 fL (ref 80.0–100.0)
Monocytes Absolute: 0.5 10*3/uL (ref 0.1–1.0)
Monocytes Relative: 10 %
Neutro Abs: 3.3 10*3/uL (ref 1.7–7.7)
Neutrophils Relative %: 65 %
Platelet Count: 243 10*3/uL (ref 150–400)
RBC: 4.28 MIL/uL (ref 3.87–5.11)
RDW: 13.3 % (ref 11.5–15.5)
WBC Count: 5 10*3/uL (ref 4.0–10.5)
nRBC: 0 % (ref 0.0–0.2)

## 2022-06-14 LAB — CMP (CANCER CENTER ONLY)
ALT: 9 U/L (ref 0–44)
AST: 14 U/L — ABNORMAL LOW (ref 15–41)
Albumin: 4.3 g/dL (ref 3.5–5.0)
Alkaline Phosphatase: 39 U/L (ref 38–126)
Anion gap: 4 — ABNORMAL LOW (ref 5–15)
BUN: 15 mg/dL (ref 8–23)
CO2: 26 mmol/L (ref 22–32)
Calcium: 9.8 mg/dL (ref 8.9–10.3)
Chloride: 108 mmol/L (ref 98–111)
Creatinine: 0.71 mg/dL (ref 0.44–1.00)
GFR, Estimated: >60 mL/min (ref >60)
Glucose, Bld: 105 mg/dL — ABNORMAL HIGH (ref 70–99)
Potassium: 4.2 mmol/L (ref 3.5–5.1)
Sodium: 138 mmol/L (ref 135–145)
Total Bilirubin: 0.4 mg/dL (ref 0.3–1.2)
Total Protein: 6.8 g/dL (ref 6.5–8.1)

## 2022-06-14 LAB — CEA (IN HOUSE-CHCC): CEA (CHCC-In House): <1 ng/mL (ref 0.00–5.00)

## 2022-06-14 MED ORDER — HEPARIN SOD (PORK) LOCK FLUSH 100 UNIT/ML IV SOLN
500.0000 [IU] | Freq: Once | INTRAVENOUS | Status: AC
  Administered 2022-06-14: 500 [IU]

## 2022-06-14 MED ORDER — SODIUM CHLORIDE 0.9% FLUSH
10.0000 mL | Freq: Once | INTRAVENOUS | Status: AC
  Administered 2022-06-14: 10 mL

## 2022-06-14 NOTE — Progress Notes (Signed)
[facility]   Telephone:([phone]) [phone] Fax:([phone]) [phone]   Clinic Follow up Note   Patient Care Team: Horald Pollen, MD as PCP - General (Internal Medicine) Truitt Merle, MD as PCP - Hematology/Oncology (Hematology) Mansouraty, Telford Nab., MD as Consulting Physician (Gastroenterology)  Date of Service:  06/14/2022  CHIEF COMPLAINT: f/u of rectal cancer  CURRENT THERAPY:  Surveillance  ASSESSMENT & PLAN:  [name] is a 76 y.o. female with   1.  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  She declined surgery.  On surveillance -most recent surveillance colonoscopy on 09/27/21 by Dr. Rush Landmark showed hemorrhoids, one polyp, and a few scattered diverticulosis throughout colon. The recommendation is for repeat in 1-2 years. -surveillance CT CAP 02/07/22 showed NED. -she reports a number of new developments-- one instance of chest pain (lasting 10 min) yesterday, episodes of dizziness, and irregular bowel movement, especially diarrhea.  Exam including rectal exam was unremarkable.  I do not have high clinical suspicion for cancer recurrence. -Port in place, continue flushes every 4 weeks (due to prior issues) she strongly prefers to keep her port. ("It's psychological," she states)  2. Diarrhea -she reports irregular bowel movement yesterday diarrhea in the past 3 to 4 months, to the point of being unable to control it st one time. I advised her to f/u with Dr. Rush Landmark for further work up. -I also told her to stop over-the-counter oral magnesium, to see if her diarrhea improves.  3. Dizziness, Chest pain -she reports she has episodes of dizziness and experienced one instance of chest pain yesterday that lasted 10 minutes (resolved on its own). -We checked orthostatic vital signs which was negative.  I advised her to discuss these concerns with her PCP.   4.  Osteoporosis, chronic back pain -previously on Prolia, held due to extensive dental work. -Chronic right back pain is stable, flares prior to BM then resolves -Mild TTP to right low/mid back, likely MSK related -Pain managed with lidocaine patches and tramadol for severe pain   5. Internal and external hemorrhoid -she met with Dr. Johney Maine on 11/07/21 to discuss surgery. She declines. -Follow-up with Dr. Rush Landmark     PLAN: -Port flush every month (pt refuses port removal) -lab and f/u in 1 year -She will call Dr. Rush Landmark for follow-up -She is scheduled to see her primary care physician next month   No problem-specific Assessment & Plan notes found for this encounter.   SUMMARY OF ONCOLOGIC HISTORY: Oncology History Overview Note  Cancer Staging Rectal cancer [facility]) Staging form: Colon and Rectum, AJCC 8th Edition - Clinical stage from 05/11/2017: cT3, cN2, cM0 - Signed by Truitt Merle, MD on 08/25/2019    Rectal cancer ([place])  05/11/2017 Cancer Staging   Staging form: Colon and Rectum, AJCC 8th Edition - Clinical stage from 05/11/2017: cT3, cN2, cM0 - Signed by Truitt Merle, MD on 08/25/2019   05/11/2017 Procedure   Colonoscopy by Dr Crissie Reese at [facility] -Palpable rectal mass found on digital rectal exam  -Likely malignant tumor in the rectum. Biopsied.  -One 6 mm polyp in the sigmoid colon, removed with a cold snare, resected and retrieved.  -Diverticulosis at the hepatic flexure and in the ascending colon -the examined portion of the ileum was normal.    09/10/2017 Initial Biopsy   Final Diagnosis  Sigmoid Polyp biopsies:  Tubular Adenoma  2. Rectal Mass biopsies:  Moderately differentiated adenocarcinoma, invasive.  The depth of the invasion cannot be assessed in this biopsy specimen.     - 06/29/2017 Chemotherapy   She received neoadjuvant infusional 5FU with concurrent radiation.     09/17/2017 Procedure   Sigmoidoscopy by Dr. Tami Ribas at  [facility] on 09/17/17  was noted to have complete response from chemoRT   10/31/2017 - 01/2018 Chemotherapy   Consolidation Chemo 5FU and Leucovorin. Oxaliplatin was deferred due to pre-existing neuropathy. Pt declined rectal surgery.    01/13/2019 Imaging   CT CAP W Contrast at [facility] show persistent and stable 27mm nodule in the right middle lobe and stable 55mm nodule in the superior segment of the left lower lobe. No new or suspect nodules   Cholelithiasis without evidence of cholecystitis.   Mild compression of the superior plate of L5, Stable   Overall no evidence of disease recurrence in the chest, abdomen or pelvis.    08/25/2019 Initial Diagnosis   Rectal cancer ([place])   09/08/2019 Imaging   CT CAP W contrast 09/08/19  IMPRESSION: 1. Areas of mild rectal wall thickening, likely radiation change. No discrete rectal mass is identified. No perirectal or pelvic or retroperitoneal lymphadenopathy. 2. No findings suspicious for metastatic disease involving the liver or lungs. 3. Cholelithiasis.   10/07/2019 Procedure   Colonoscopy by Dr. Silvestre Moment 10/07/19 IMPRESSION - Hemorrhoids found on digital rectal exam. - The examined portion of the ileum was normal. - One 4 mm polyp in the ascending colon, removed with a cold snare. Resected and retrieved. - A single colonic angioectasia. - Scar in the distal rectum. - Diverticulosis in the recto-sigmoid colon, in the sigmoid colon, in the descending colon and at the hepatic flexure. - Normal mucosa in the entire examined colon otherwise. - Non-bleeding non-thrombosed external and internal hemorrhoids. Diagnosis Surgical [P], colon, ascending, polyp - BENIGN COLONIC MUCOSA WITH LYMPHOID AGGREGATE. - NO DYSPLASIA OR MALIGNANCY.   06/03/2020 Imaging   MRI Pelvis  IMPRESSION: No evidence of residual/recurrent or metastatic disease within the pelvis.   10/27/2020 Procedure   Colonoscopy by Dr Rush Landmark   IMPRESSION - Hemorrhoids found on digital rectal exam. - Two 2 to 3 mm polyps in the ascending colon and in the cecum. - Normal mucosa in the entire examined colon. - Diverticulosis in the recto-sigmoid colon and in the sigmoid colon. - Non-bleeding non-thrombosed external and internal hemorrhoids.     Diagnosis Surgical [P], colon, ascending, cecum, polyps (2) - TUBULAR ADENOMA (1 FRAGMENTS) - MULTIPLE FRAGMENTS OF BENIGN COLONIC MUCOSA - NO HIGH GRADE DYSPLASIA OR MALIGNANCY IDENTIFIED   02/03/2021 Imaging   CT CAP  IMPRESSION: 1. Post treatment change in the rectal wall without overt mass like lesion. 2. No evidence of metastatic disease within the chest abdomen or pelvis. 3. Hepatic steatosis. 4. Cholelithiasis. 5. Aortic atherosclerosis.   Aortic Atherosclerosis (ICD10-I70.0).       INTERVAL HISTORY:  [name] is here for a follow up of rectal cancer. She was last seen by me on 12/15/21. She presents to the clinic alone. She reports experiencing a scary event yesterday-- she explains she suddenly experienced SOB, chest tightness/pain to under her sternum. She notes it lasted about 10 minutes and resolved on its own. She also reports episodes of dizziness. She also reports diarrhea with accidents.   All other systems were reviewed with the patient and are negative.  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  ALLERGIES:  is allergic to morphine and related and other.  MEDICATIONS:  Current Outpatient Medications  Medication Sig Dispense Refill   acetaminophen (TYLENOL) 500 MG tablet Take 500 mg by mouth every 6 (six) hours as needed for moderate pain or mild pain.     Cholecalciferol (VITAMIN D3) 25 MCG (1000 UT) CAPS Take by mouth daily.     lidocaine (LIDODERM) 5 % PLACE 1 PATCH ONTO THE SKIN DAILY. REMOVE & DISCARD PATCH WITHIN 12 HOURS OR AS DIRECTED BY MD 30 patch 11   lidocaine-prilocaine (EMLA) cream Apply 1 application topically as needed. 30 g 1   ondansetron (ZOFRAN ODT) 4 MG disintegrating tablet Take 1 tablet (4 mg total) by mouth every 8 (eight) hours as needed for nausea or vomiting. 20 tablet 0   polycarbophil (FIBERCON) 625 MG tablet Take 625 mg by mouth daily.     TRAMADOL HCL PO Take by mouth daily as needed.     valACYclovir (VALTREX) 1000 MG tablet TAKE 1 TABLET BY MOUTH AS NEEDED AS DIRECTED BY PROVIDER. TAKES AS NEEDED 90 tablet 1   No current facility-administered medications for this visit.    PHYSICAL EXAMINATION: ECOG PERFORMANCE STATUS: 0 - Asymptomatic  Vitals:   06/14/22 1107 06/14/22 1108  BP: 130/70 120/72  Pulse:    Temp:    SpO2:     Wt Readings from Last 3 Encounters:  06/14/22 150 lb 1.6 oz (68.1 kg)  01/26/22 148 lb (67.1 kg)  12/14/21 146 lb 11.2 oz (66.5 kg)     GENERAL:alert, no distress and comfortable SKIN: skin color, texture, turgor are normal, no rashes or significant lesions EYES: normal, Conjunctiva are pink and non-injected, sclera clear  NECK: supple, thyroid normal size, non-tender, without  nodularity LYMPH:  no palpable lymphadenopathy in the cervical, axillary or inguinal, (+) tenderness to right groin LUNGS: clear to auscultation and percussion with normal breathing effort HEART: regular rate & rhythm and no murmurs and no lower extremity edema ABDOMEN:abdomen soft, non-tender and normal bowel sounds Musculoskeletal:no cyanosis of digits and no clubbing  NEURO: alert & oriented x 3 with fluent speech, no focal motor/sensory deficits RECTAL: No palpable mass. No blood on glove. Normal stool present. Benign exam    LABORATORY DATA:  I have reviewed the data as listed    Latest Ref Rng & Units 06/14/2022   10:02 AM 12/14/2021    9:32 AM 09/14/2021   11:26 AM  CBC  WBC 4.0 - 10.5 K/uL  5.0  4.0  5.0   Hemoglobin 12.0 - 15.0 g/dL 12.4  11.7  13.0   Hematocrit 36.0 - 46.0 % 36.3  34.6  39.4   Platelets 150 - 400 K/uL 243  233  260.0         Latest Ref Rng & Units 06/14/2022   10:02 AM 12/14/2021    9:32 AM 09/14/2021   11:26 AM  CMP  Glucose 70 - 99 mg/dL 105  93  96   BUN 8 - 23 mg/dL $Remove'15  12  14   'cPLyUpa$ Creatinine 0.44 - 1.00 mg/dL 0.71  0.72  0.76   Sodium 135 - 145 mmol/L 138  139  141   Potassium 3.5 - 5.1 mmol/L 4.2  3.9  5.1   Chloride 98 - 111 mmol/L 108  107  106   CO2 22 - 32 mmol/L $RemoveB'26  27  29   'MmXGvKpM$ Calcium 8.9 - 10.3 mg/dL 9.8  10.0  11.2   Total Protein 6.5 - 8.1 g/dL 6.8  6.7  7.3   Total Bilirubin 0.3 - 1.2 mg/dL 0.4  0.8  0.7   Alkaline Phos 38 - 126 U/L 39  36  36   AST 15 - 41 U/L $Remo'14  14  19   'quidF$ ALT 0 - 44 U/L $Remo'9  11  15       'OWAlT$ RADIOGRAPHIC STUDIES: I have personally reviewed the radiological images as listed and agreed with the findings in the report. No results found.    No orders of the defined types were placed in this encounter.  All questions were answered. The patient knows to call the clinic with any problems, questions or concerns. No barriers to learning was detected. The total time spent in the appointment was 30 minutes.     Truitt Merle,  MD 06/14/2022   I, Wilburn Mylar, am acting as scribe for Truitt Merle, MD.   I have reviewed the above documentation for accuracy and completeness, and I agree with the above.

## 2022-06-15 ENCOUNTER — Telehealth: Payer: Self-pay | Admitting: Hematology

## 2022-06-15 NOTE — Telephone Encounter (Signed)
Scheduled follow-up appointments per 7/26 los. Patient is aware. 

## 2022-06-21 ENCOUNTER — Encounter: Payer: Self-pay | Admitting: Emergency Medicine

## 2022-06-21 ENCOUNTER — Ambulatory Visit: Payer: Medicaid Other | Admitting: Emergency Medicine

## 2022-06-21 VITALS — BP 132/66 | HR 75 | Temp 97.8°F | Ht 63.0 in | Wt 149.0 lb

## 2022-06-21 DIAGNOSIS — R2689 Other abnormalities of gait and mobility: Secondary | ICD-10-CM | POA: Diagnosis not present

## 2022-06-21 DIAGNOSIS — R079 Chest pain, unspecified: Secondary | ICD-10-CM | POA: Diagnosis not present

## 2022-06-21 DIAGNOSIS — Z85048 Personal history of other malignant neoplasm of rectum, rectosigmoid junction, and anus: Secondary | ICD-10-CM

## 2022-06-21 DIAGNOSIS — R519 Headache, unspecified: Secondary | ICD-10-CM

## 2022-06-21 DIAGNOSIS — R42 Dizziness and giddiness: Secondary | ICD-10-CM | POA: Diagnosis not present

## 2022-06-21 NOTE — Patient Instructions (Signed)

## 2022-06-21 NOTE — Assessment & Plan Note (Signed)
Stable and in remission.  Most recent oncologist office visit notes reviewed with patient. Most recent blood work done last month within normal limits.

## 2022-06-21 NOTE — Assessment & Plan Note (Signed)
1 episode last week lasting about 10 minutes.  Normal EKG.  Atypical features.  Suspected esophageal spasm.

## 2022-06-21 NOTE — Progress Notes (Signed)
Rainee Sweatt 76 y.o.   Chief Complaint  Patient presents with   Follow-up    Pt had chest pain on last Wednesday   Dizziness    On and off     HISTORY OF PRESENT ILLNESS: This is a 76 y.o. female complaining of intermittent episodes of dizziness for the past 3 to 4 months along with intermittent headaches and troubles with her balance.  No other associated symptoms.  Denies syncope.  Has decreased left eye vision due to macular degeneration. Denies fever or flulike symptoms.  Denies nausea or vomiting. Her blood work including CBC and CMP done on 06/14/2022 was normal. Had episode of chest pain 2 weeks ago which lasted about 10 minutes.  Asymptomatic now.  No associated symptoms.  No cardiac history. Patient has history of rectal cancer.  Stable and doing well as per last oncology evaluation last month. Patient has history of explosive headache about 10 years ago while in [place].  Had brain MRI and neurology evaluation with normal findings. Most recent oncology evaluation assessment and plan as follows: ASSESSMENT & PLAN:  Fotini Lemus is a 76 y.o. female with    1.  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  She declined surgery.  On surveillance -most recent surveillance colonoscopy on 09/27/21 by Dr. Rush Landmark showed hemorrhoids, one polyp, and a few scattered diverticulosis throughout colon. The recommendation is for repeat in 1-2 years. -surveillance CT CAP 02/07/22 showed NED. -she reports a number of new developments-- one instance of chest pain (lasting 10 min) yesterday, episodes of dizziness, and irregular bowel movement, especially diarrhea.  Exam including rectal exam was unremarkable.  I do not have high clinical suspicion for cancer recurrence. -Port in place, continue flushes every 4 weeks (due to prior issues) she strongly prefers to keep her port. ("It's  psychological," she states)   2. Diarrhea -she reports irregular bowel movement yesterday diarrhea in the past 3 to 4 months, to the point of being unable to control it st one time. I advised her to f/u with Dr. Rush Landmark for further work up. -I also told her to stop over-the-counter oral magnesium, to see if her diarrhea improves.   3. Dizziness, Chest pain -she reports she has episodes of dizziness and experienced one instance of chest pain yesterday that lasted 10 minutes (resolved on its own). -We checked orthostatic vital signs which was negative.  I advised her to discuss these concerns with her PCP.   4. Osteoporosis, chronic back pain -previously on Prolia, held due to extensive dental work. -Chronic right back pain is stable, flares prior to BM then resolves -Mild TTP to right low/mid back, likely MSK related -Pain managed with lidocaine patches and tramadol for severe pain   5. Internal and external hemorrhoid -she met with Dr. Johney Maine on 11/07/21 to discuss surgery. She declines. -Follow-up with Dr. Rush Landmark     PLAN: -Port flush every month (pt refuses port removal) -lab and f/u in 1 year -She will call Dr. Rush Landmark for follow-up -She is scheduled to see her primary care physician next month    HPI   Prior to Admission medications   Medication Sig Start Date End Date Taking? Authorizing Provider  acetaminophen (TYLENOL) 500 MG tablet Take 500 mg by mouth every 6 (six) hours as needed for moderate pain or mild pain.   Yes [provider]  Cholecalciferol (VITAMIN D3) 25 MCG (1000 UT) CAPS Take by mouth  daily.   Yes [provider]  lidocaine (LIDODERM) 5 % PLACE 1 PATCH ONTO THE SKIN DAILY. REMOVE & DISCARD PATCH WITHIN 12 HOURS OR AS DIRECTED BY MD 01/26/22  Yes Horald Pollen, MD  lidocaine-prilocaine (EMLA) cream Apply 1 application topically as needed. 08/10/21  Yes Truitt Merle, MD  ondansetron (ZOFRAN ODT) 4 MG disintegrating tablet Take 1  tablet (4 mg total) by mouth every 8 (eight) hours as needed for nausea or vomiting. 06/17/21  Yes Volney American, PA-C  polycarbophil (FIBERCON) 625 MG tablet Take 625 mg by mouth daily.   Yes [provider]  TRAMADOL HCL PO Take by mouth daily as needed.   Yes [provider]  valACYclovir (VALTREX) 1000 MG tablet TAKE 1 TABLET BY MOUTH AS NEEDED AS DIRECTED BY PROVIDER. TAKES AS NEEDED 01/06/22  Yes Horald Pollen, MD    Allergies  Allergen Reactions   Morphine And Related Anaphylaxis   Other Other (See Comments)    Stitches reject    Patient Active Problem List   Diagnosis Date Noted   Skin lesions, generalized 01/26/2022   Acute meniscal tear of left knee 01/26/2022   History of rectal cancer 01/26/2022   Right hip pain 09/14/2021   Chronic pain of left knee 09/14/2021   Port-A-Cath in place 06/07/2020   Osteoporosis 12/25/2019   Effusion of joint of right upper arm 11/29/2019   Rectal cancer ([place]) 08/25/2019    Past Medical History:  Diagnosis Date   Anemia    Anxiety    Arthritis    knee   Cataract    Colon cancer ([place])    Heart murmur    History of kidney stones     in system   Hyperlipidemia    Kidney stones    Macular degeneration    Neuromuscular disorder ([place])    Osteoporosis    Rectal cancer ([place])    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[place])     Past Surgical History:  Procedure Laterality Date   ABDOMINAL HYSTERECTOMY     COLONOSCOPY  09/27/2021   10/07/2019  hx of rectal cancer   EYE SURGERY Bilateral    cataract   SPINE SURGERY  2017   schwannoma of spinal cord   TONSILLECTOMY     TOOTH EXTRACTION N/A 11/26/2020   Procedure: DENTAL RESTORATION/EXTRACTIONS;  Surgeon: Diona Browner, DMD;  Location: [facility];  Service: Oral Surgery;  Laterality: N/A;    Social History   Socioeconomic History   Marital status: Divorced    Spouse name: Not on file   Number of children: 1   Years of education: Not on file   Highest  education level: Not on file  Occupational History   Not on file  Tobacco Use   Smoking status: Former    Packs/day: 2.00    Years: 26.00    Total pack years: 52.00    Types: Cigarettes    Quit date: 11/21/1987    Years since quitting: 34.6   Smokeless tobacco: Never  Vaping Use   Vaping Use: Never used  Substance and Sexual Activity   Alcohol use: Yes    Alcohol/week: 1.0 standard drink of alcohol    Types: 1 Glasses of wine per week    Comment: wine socially   Drug use: Yes    Types: Other-see comments    Comment: CBD   Sexual activity: Yes  Other Topics Concern   Not on file  Social History Narrative   Not  on file   Social Determinants of Health   Financial Resource Strain: Not on file  Food Insecurity: Not on file  Transportation Needs: Not on file  Physical Activity: Not on file  Stress: Not on file  Social Connections: Not on file  Intimate Partner Violence: Not on file    Family History  Problem Relation Age of Onset   Cancer Mother        head and neck cancer    Cancer Sister        lung cancer    Cancer Brother        pancreatic cancer    Pancreatic cancer Brother    Cancer Paternal Grandmother        breast cancer    Colon cancer Neg Hx    Esophageal cancer Neg Hx    Rectal cancer Neg Hx    Stomach cancer Neg Hx    Inflammatory bowel disease Neg Hx    Liver disease Neg Hx      Review of Systems  Constitutional: Negative.  Negative for chills and fever.  HENT: Negative.  Negative for congestion and sore throat.   Eyes:  Positive for blurred vision (left eye).  Respiratory: Negative.  Negative for cough and shortness of breath.   Cardiovascular:  Positive for chest pain.  Gastrointestinal:  Negative for abdominal pain, diarrhea, nausea and vomiting.  Genitourinary: Negative.  Negative for dysuria and hematuria.  Skin: Negative.  Negative for rash.  Neurological:  Positive for dizziness and headaches.  All other systems reviewed and are  negative.   Today's Vitals   06/21/22 1359  BP: 132/66  Pulse: 75  Temp: 97.8 F (36.6 C)  TempSrc: Oral  SpO2: 98%  Weight: 149 lb (67.6 kg)  Height: $Remove'5\' 3"'QbZKXGl$  (1.6 m)   Body mass index is 26.39 kg/m. Wt Readings from Last 3 Encounters:  06/21/22 149 lb (67.6 kg)  06/14/22 150 lb 1.6 oz (68.1 kg)  01/26/22 148 lb (67.1 kg)    Physical Exam Vitals reviewed.  Constitutional:      Appearance: Normal appearance.  HENT:     Head: Normocephalic.     Right Ear: Tympanic membrane, ear canal and external ear normal.     Left Ear: Tympanic membrane, ear canal and external ear normal.     Mouth/Throat:     Mouth: Mucous membranes are moist.     Pharynx: Oropharynx is clear.  Eyes:     Extraocular Movements: Extraocular movements intact.     Conjunctiva/sclera: Conjunctivae normal.     Pupils: Pupils are equal, round, and reactive to light.  Neck:     Vascular: No carotid bruit.  Cardiovascular:     Rate and Rhythm: Normal rate and regular rhythm.     Pulses: Normal pulses.     Heart sounds: Normal heart sounds.  Pulmonary:     Effort: Pulmonary effort is normal.     Breath sounds: Normal breath sounds.  Abdominal:     Palpations: Abdomen is soft.     Tenderness: There is no abdominal tenderness.  Musculoskeletal:     Cervical back: No tenderness.     Right lower leg: No edema.     Left lower leg: No edema.  Lymphadenopathy:     Cervical: No cervical adenopathy.  Skin:    General: Skin is warm and dry.  Neurological:     General: No focal deficit present.     Mental Status: She is alert and oriented to  person, place, and time.     Cranial Nerves: No cranial nerve deficit.     Sensory: No sensory deficit.     Motor: No weakness.     Coordination: Coordination abnormal.     Gait: Gait abnormal.  Psychiatric:        Mood and Affect: Mood normal.        Behavior: Behavior normal.    EKG: Normal sinus rhythm with first-degree AV block and ventricular response of  69/min.  No acute ischemic changes.  Otherwise normal.  ASSESSMENT & PLAN: A total of 55 minutes was spent with the patient and counseling/coordination of care regarding preparing for this visit, review of most recent office visit notes, review of oncologist's most recent office visit note, differential diagnosis of dizziness/headache/balance problems and need for neurology evaluation and brain MRI, review of most recent blood work results, education on nutrition, differential diagnosis of chest pain, prognosis, documentation, and need for follow-up  Problem List Items Addressed This Visit       Other   History of rectal cancer    Stable and in remission.  Most recent oncologist office visit notes reviewed with patient. Most recent blood work done last month within normal limits.      Relevant Orders   MR Brain W Wo Contrast   Dizziness - Primary    Differential diagnosis discussed including TIA and metastatic disease. Could be CNS related.  Also has headaches which are not common for her and balance issues. Needs neurology evaluation. Needs brain MRI.      Relevant Orders   Ambulatory referral to Neurology   MR Brain W Wo Contrast   Nonintractable episodic headache   Relevant Orders   Ambulatory referral to Neurology   MR Brain W Wo Contrast   Balance problem    Along with dizziness, of recent onset.  Affecting quality of life. Needs neurological evaluation.      Relevant Orders   Ambulatory referral to Neurology   MR Brain W Wo Contrast   Nonspecific chest pain    1 episode last week lasting about 10 minutes.  Normal EKG.  Atypical features.  Suspected esophageal spasm.      Relevant Orders   EKG 12-Lead   Patient Instructions  Dizziness Dizziness is a common problem. It makes you feel unsteady or light-headed. You may feel like you are about to pass out (faint). Dizziness can lead to getting hurt if you stumble or fall. Dizziness can be caused by many things,  including: Medicines. Not having enough water in your body (dehydration). Illness. Follow these instructions at home: Eating and drinking  Drink enough fluid to keep your pee (urine) pale yellow. This helps to keep you from getting dehydrated. Try to drink more clear fluids, such as water. Do not drink alcohol. Limit how much caffeine you drink or eat, if your doctor tells you to do that. Limit how much salt (sodium) you drink or eat, if your doctor tells you to do that. Activity pibName   Agustina Caroli, MD [facility]

## 2022-06-21 NOTE — Assessment & Plan Note (Signed)
Differential diagnosis discussed including TIA and metastatic disease. Could be CNS related.  Also has headaches which are not common for her and balance issues. Needs neurology evaluation. Needs brain MRI.

## 2022-06-21 NOTE — Assessment & Plan Note (Signed)
Along with dizziness, of recent onset.  Affecting quality of life. Needs neurological evaluation.

## 2022-06-23 DIAGNOSIS — D2362 Other benign neoplasm of skin of left upper limb, including shoulder: Secondary | ICD-10-CM | POA: Diagnosis not present

## 2022-06-23 DIAGNOSIS — L821 Other seborrheic keratosis: Secondary | ICD-10-CM | POA: Diagnosis not present

## 2022-06-23 DIAGNOSIS — L814 Other melanin hyperpigmentation: Secondary | ICD-10-CM | POA: Diagnosis not present

## 2022-06-26 ENCOUNTER — Encounter: Payer: Self-pay | Admitting: Neurology

## 2022-06-29 ENCOUNTER — Telehealth: Payer: Self-pay | Admitting: Gastroenterology

## 2022-06-29 NOTE — Telephone Encounter (Signed)
Patient called wanted to be scheduled for intestinal MRI. Please call to advise

## 2022-06-30 NOTE — Telephone Encounter (Signed)
Left message on machine to call back  

## 2022-07-02 DIAGNOSIS — R109 Unspecified abdominal pain: Secondary | ICD-10-CM | POA: Diagnosis not present

## 2022-07-02 DIAGNOSIS — M549 Dorsalgia, unspecified: Secondary | ICD-10-CM | POA: Diagnosis not present

## 2022-07-02 DIAGNOSIS — R110 Nausea: Secondary | ICD-10-CM | POA: Diagnosis not present

## 2022-07-03 NOTE — Telephone Encounter (Signed)
The pt was actually calling to confirm that she has an appt on 9/14 with Dr Rush Landmark to discuss GI complaints.  I did confirm with her that the appt has been scheduled.

## 2022-07-04 ENCOUNTER — Ambulatory Visit
Admission: RE | Admit: 2022-07-04 | Discharge: 2022-07-04 | Disposition: A | Discharge status: Home / Self Care | Payer: Medicaid Other | Source: Ambulatory Visit | Attending: Emergency Medicine | Admitting: Emergency Medicine

## 2022-07-04 DIAGNOSIS — R2689 Other abnormalities of gait and mobility: Secondary | ICD-10-CM

## 2022-07-04 DIAGNOSIS — R42 Dizziness and giddiness: Secondary | ICD-10-CM

## 2022-07-04 DIAGNOSIS — C19 Malignant neoplasm of rectosigmoid junction: Secondary | ICD-10-CM | POA: Diagnosis not present

## 2022-07-04 DIAGNOSIS — Z85048 Personal history of other malignant neoplasm of rectum, rectosigmoid junction, and anus: Secondary | ICD-10-CM

## 2022-07-04 DIAGNOSIS — R519 Headache, unspecified: Secondary | ICD-10-CM

## 2022-07-04 MED ORDER — GADOBENATE DIMEGLUMINE 529 MG/ML IV SOLN
14.0000 mL | Freq: Once | INTRAVENOUS | Status: AC | PRN
  Administered 2022-07-04: 14 mL via INTRAVENOUS

## 2022-07-06 ENCOUNTER — Telehealth: Payer: Self-pay | Admitting: Emergency Medicine

## 2022-07-07 ENCOUNTER — Telehealth: Payer: Self-pay | Admitting: Emergency Medicine

## 2022-07-07 NOTE — Telephone Encounter (Signed)
Pt states she was seen at urgent care this weekend and was given cyclobenzaprine for pain. Pt stated the rx made her constipated and she she was not able to release how bowels for four days. Pt is requesting a refill on her tramadol and she said she is throwing away the cyclobenzaprine  She [facility]   [id number] [place] [place]:  [phone] Fax:  [phone]

## 2022-07-08 ENCOUNTER — Other Ambulatory Visit: Payer: Self-pay | Admitting: Emergency Medicine

## 2022-07-08 MED ORDER — TRAMADOL HCL 50 MG PO TABS: 50.0000 mg | ORAL_TABLET | Freq: Three times a day (TID) | ORAL | 0 refills | Status: AC | PRN

## 2022-07-08 NOTE — Telephone Encounter (Signed)
New prescription sent to pharmacy requested.  Thanks

## 2022-07-10 ENCOUNTER — Ambulatory Visit: Payer: Medicaid Other | Admitting: Emergency Medicine

## 2022-07-10 NOTE — Telephone Encounter (Signed)
Called patient to inform her that her medication for Tramadol  was sent to her requested pharmacy

## 2022-07-12 ENCOUNTER — Other Ambulatory Visit: Payer: Self-pay

## 2022-07-12 ENCOUNTER — Inpatient Hospital Stay: Payer: Medicaid Other | Attending: Hematology

## 2022-07-12 DIAGNOSIS — M810 Age-related osteoporosis without current pathological fracture: Secondary | ICD-10-CM

## 2022-07-12 DIAGNOSIS — Z95828 Presence of other vascular implants and grafts: Secondary | ICD-10-CM

## 2022-07-12 DIAGNOSIS — C20 Malignant neoplasm of rectum: Secondary | ICD-10-CM | POA: Insufficient documentation

## 2022-07-12 DIAGNOSIS — Z452 Encounter for adjustment and management of vascular access device: Secondary | ICD-10-CM | POA: Diagnosis not present

## 2022-07-12 MED ORDER — SODIUM CHLORIDE 0.9% FLUSH
10.0000 mL | Freq: Once | INTRAVENOUS | Status: AC
  Administered 2022-07-12: 10 mL

## 2022-07-12 MED ORDER — HEPARIN SOD (PORK) LOCK FLUSH 100 UNIT/ML IV SOLN
500.0000 [IU] | Freq: Once | INTRAVENOUS | Status: AC
  Administered 2022-07-12: 500 [IU]

## 2022-08-03 ENCOUNTER — Ambulatory Visit: Payer: Medicaid Other | Admitting: Gastroenterology

## 2022-08-03 ENCOUNTER — Ambulatory Visit: Payer: Medicaid Other | Admitting: Emergency Medicine

## 2022-08-09 ENCOUNTER — Inpatient Hospital Stay: Payer: Medicaid Other

## 2022-08-16 ENCOUNTER — Other Ambulatory Visit: Payer: Self-pay

## 2022-08-16 ENCOUNTER — Inpatient Hospital Stay: Payer: Medicaid Other | Attending: Hematology

## 2022-08-16 DIAGNOSIS — Z95828 Presence of other vascular implants and grafts: Secondary | ICD-10-CM

## 2022-08-16 DIAGNOSIS — Z452 Encounter for adjustment and management of vascular access device: Secondary | ICD-10-CM | POA: Insufficient documentation

## 2022-08-16 DIAGNOSIS — Z85048 Personal history of other malignant neoplasm of rectum, rectosigmoid junction, and anus: Secondary | ICD-10-CM | POA: Diagnosis present

## 2022-08-16 DIAGNOSIS — M810 Age-related osteoporosis without current pathological fracture: Secondary | ICD-10-CM

## 2022-08-16 MED ORDER — SODIUM CHLORIDE 0.9% FLUSH
10.0000 mL | Freq: Once | INTRAVENOUS | Status: AC
  Administered 2022-08-16: 10 mL

## 2022-08-16 MED ORDER — HEPARIN SOD (PORK) LOCK FLUSH 100 UNIT/ML IV SOLN
500.0000 [IU] | Freq: Once | INTRAVENOUS | Status: AC
  Administered 2022-08-16: 500 [IU]

## 2022-09-03 DIAGNOSIS — S61112A Laceration without foreign body of left thumb with damage to nail, initial encounter: Secondary | ICD-10-CM | POA: Diagnosis not present

## 2022-09-03 DIAGNOSIS — W260XXA Contact with knife, initial encounter: Secondary | ICD-10-CM | POA: Diagnosis not present

## 2022-09-05 DIAGNOSIS — Z4801 Encounter for change or removal of surgical wound dressing: Secondary | ICD-10-CM | POA: Diagnosis not present

## 2022-09-05 DIAGNOSIS — S61112D Laceration without foreign body of left thumb with damage to nail, subsequent encounter: Secondary | ICD-10-CM | POA: Diagnosis not present

## 2022-09-07 ENCOUNTER — Other Ambulatory Visit: Payer: Self-pay | Admitting: Hematology

## 2022-09-07 DIAGNOSIS — S61412A Laceration without foreign body of left hand, initial encounter: Secondary | ICD-10-CM | POA: Diagnosis not present

## 2022-09-07 DIAGNOSIS — Z79899 Other long term (current) drug therapy: Secondary | ICD-10-CM | POA: Diagnosis not present

## 2022-09-07 DIAGNOSIS — Z888 Allergy status to other drugs, medicaments and biological substances status: Secondary | ICD-10-CM | POA: Diagnosis not present

## 2022-09-07 DIAGNOSIS — M79645 Pain in left finger(s): Secondary | ICD-10-CM | POA: Diagnosis not present

## 2022-09-07 DIAGNOSIS — T8140XA Infection following a procedure, unspecified, initial encounter: Secondary | ICD-10-CM | POA: Diagnosis not present

## 2022-09-07 DIAGNOSIS — S61012A Laceration without foreign body of left thumb without damage to nail, initial encounter: Secondary | ICD-10-CM | POA: Diagnosis not present

## 2022-09-07 DIAGNOSIS — L089 Local infection of the skin and subcutaneous tissue, unspecified: Secondary | ICD-10-CM | POA: Diagnosis not present

## 2022-09-08 ENCOUNTER — Other Ambulatory Visit: Payer: Self-pay

## 2022-09-11 ENCOUNTER — Ambulatory Visit
Admission: EM | Admit: 2022-09-11 | Discharge: 2022-09-11 | Disposition: A | Discharge status: Home / Self Care | Payer: Medicaid Other | Attending: Family Medicine | Admitting: Family Medicine

## 2022-09-11 ENCOUNTER — Encounter: Payer: Self-pay | Admitting: Emergency Medicine

## 2022-09-11 DIAGNOSIS — Z4802 Encounter for removal of sutures: Secondary | ICD-10-CM | POA: Diagnosis not present

## 2022-09-11 NOTE — Discharge Instructions (Addendum)
Finish taking the sulfamethoxazole antibiotic Elevate to reduce the swelling Return as needed

## 2022-09-11 NOTE — ED Provider Notes (Signed)
[name]    CSN: [id number] Arrival date & time: 09/11/22  1101      History   Chief Complaint Chief Complaint  Patient presents with   Suture / Staple Removal    HPI [name] is a 76 y.o. female.   HPI  Patient is here for suture removal.  Stitches have been in place for 9 days.  She went to the ER couple days ago because of infection.  She was placed on Septra DS.  She states this is helped.  Still has some tenderness over the site.  Past Medical History:  Diagnosis Date   Anemia    Anxiety    Arthritis    knee   Cataract    Colon cancer ([place])    Heart murmur    History of kidney stones     in system   Hyperlipidemia    Kidney stones    Macular degeneration    Neuromuscular disorder ([facility])    Osteoporosis    Rectal cancer ([facility])    Schwannoma of spinal cord [facility])     Patient Active Problem List   Diagnosis Date Noted   Dizziness 06/21/2022   Nonintractable episodic headache 06/21/2022   Balance problem 06/21/2022   Nonspecific chest pain 06/21/2022   Skin lesions, generalized 01/26/2022   Acute meniscal tear of left knee 01/26/2022   History of rectal cancer 01/26/2022   Right hip pain 09/14/2021   Chronic pain of left knee 09/14/2021   Port-A-Cath in place 06/07/2020   Osteoporosis 12/25/2019   Effusion of joint of right upper arm 11/29/2019   Rectal cancer ([place]) 08/25/2019    Past Surgical History:  Procedure Laterality Date   ABDOMINAL HYSTERECTOMY     COLONOSCOPY  09/27/2021   10/07/2019  hx of rectal cancer   EYE SURGERY Bilateral    cataract   SPINE SURGERY  2017   schwannoma of spinal cord   TONSILLECTOMY     TOOTH EXTRACTION N/A 11/26/2020   Procedure: DENTAL RESTORATION/EXTRACTIONS;  Surgeon: Diona Browner, DMD;  Location: [facility];  Service: Oral Surgery;  Laterality: N/A;    OB History   No obstetric history on file.      Home Medications    Prior to Admission medications   Medication Sig Start  Date End Date Taking? Authorizing Provider  acetaminophen (TYLENOL) 500 MG tablet Take 500 mg by mouth every 6 (six) hours as needed for moderate pain or mild pain.   Yes [provider]  Cholecalciferol (VITAMIN D3) 25 MCG (1000 UT) CAPS Take by mouth daily.   Yes [provider]  lidocaine (LIDODERM) 5 % PLACE 1 PATCH ONTO THE SKIN DAILY. REMOVE & DISCARD PATCH WITHIN 12 HOURS OR AS DIRECTED BY MD 01/26/22  Yes Sagardia, Ines Bloomer, MD  lidocaine-prilocaine (EMLA) cream APPLY TOPICALLY AS NEEDED 09/08/22  Yes Truitt Merle, MD  ondansetron (ZOFRAN ODT) 4 MG disintegrating tablet Take 1 tablet (4 mg total) by mouth every 8 (eight) hours as needed for nausea or vomiting. 06/17/21  Yes Volney American, PA-C  polycarbophil (FIBERCON) 625 MG tablet Take 625 mg by mouth daily.   Yes [provider]  valACYclovir (VALTREX) 1000 MG tablet TAKE 1 TABLET BY MOUTH AS NEEDED AS DIRECTED BY PROVIDER. TAKES AS NEEDED 01/06/22  Yes Horald Pollen, MD    Family History Family History  Problem Relation Age of Onset   Cancer Mother        head and neck  cancer    Cancer Sister        lung cancer    Cancer Brother        pancreatic cancer    Pancreatic cancer Brother    Cancer Paternal Grandmother        breast cancer    Colon cancer Neg Hx    Esophageal cancer Neg Hx    Rectal cancer Neg Hx    Stomach cancer Neg Hx    Inflammatory bowel disease Neg Hx    Liver disease Neg Hx     Social History Social History   Tobacco Use   Smoking status: Former    Packs/day: 2.00    Years: 26.00    Total pack years: 52.00    Types: Cigarettes    Quit date: 11/21/1987    Years since quitting: 34.8   Smokeless tobacco: Never  Vaping Use   Vaping Use: Never used  Substance Use Topics   Alcohol use: Yes    Alcohol/week: 1.0 standard drink of alcohol    Types: 1 Glasses of wine per week    Comment: wine socially   Drug use: Yes    Types: Other-see comments    Comment:  CBD     Allergies   Morphine and related and Other   Review of Systems Review of Systems See HPI  Physical Exam Triage Vital Signs ED Triage Vitals  Enc Vitals Group     BP 09/11/22 1113 133/75     Pulse Rate 09/11/22 1113 76     Resp 09/11/22 1113 18     Temp 09/11/22 1113 98.7 F (37.1 C)     Temp Source 09/11/22 1113 Oral     SpO2 09/11/22 1113 96 %     Weight 09/11/22 1116 153 lb (69.4 kg)     Height 09/11/22 1116 '5\' 3"'$  (1.6 m)     Head Circumference --      Peak Flow --      Pain Score 09/11/22 1115 5     Pain Loc --      Pain Edu? --      Excl. in [place]? --    No data found.  Updated Vital Signs BP 133/75 (BP Location: Left Arm)   Pulse 76   Temp 98.7 F (37.1 C) (Oral)   Resp 18   Ht '5\' 3"'$  (1.6 m)   Wt 69.4 kg   SpO2 96%   BMI 27.10 kg/m      Physical Exam Constitutional:      General: She is not in acute distress.    Appearance: She is well-developed.  HENT:     Head: Normocephalic and atraumatic.  Eyes:     Conjunctiva/sclera: Conjunctivae normal.     Pupils: Pupils are equal, round, and reactive to light.  Cardiovascular:     Rate and Rhythm: Normal rate.  Pulmonary:     Effort: Pulmonary effort is normal. No respiratory distress.  Abdominal:     General: There is no distension.     Palpations: Abdomen is soft.  Musculoskeletal:        General: Normal range of motion.     Cervical back: Normal range of motion.  Skin:    General: Skin is warm and dry.     Comments: Healing wound is noted to the tip of the left thumb.  3 sutures were removed without difficulty.  No evidence of dehiscence.  Slight puffiness of the thumb pad as compared to opposite.  No erythema or warmth, no drainage  Neurological:     Mental Status: She is alert.      UC Treatments / Results  Labs (all labs ordered are listed, but only abnormal results are displayed) Labs Reviewed - No data to display  EKG   Radiology No results found.  Procedures Procedures  (including critical care time)  Medications Ordered in UC Medications - No data to display  Initial Impression / Assessment and Plan / UC Course  I have reviewed the triage vital signs and the nursing notes.  Pertinent labs & imaging results that were available during my care of the patient were reviewed by me and considered in my medical decision making (see chart for details).     Final Clinical Impressions(s) / UC Diagnoses   Final diagnoses:  Visit for suture removal     Discharge Instructions      Finish taking the sulfamethoxazole antibiotic Elevate to reduce the swelling Return as needed    ED Prescriptions   None    PDMP not reviewed this encounter.   Raylene Everts, MD 09/11/22 [phone]

## 2022-09-11 NOTE — ED Triage Notes (Signed)
Patient here to have sutures removed out of left thumb that were placed on 09-03-22.  Patient instructed to have removed in 3 days by ED.  Patient still having a lot of pain in left thumb and tender to touch.

## 2022-09-13 ENCOUNTER — Inpatient Hospital Stay: Payer: Medicaid Other | Attending: Hematology

## 2022-09-13 DIAGNOSIS — Z85048 Personal history of other malignant neoplasm of rectum, rectosigmoid junction, and anus: Secondary | ICD-10-CM | POA: Insufficient documentation

## 2022-09-13 DIAGNOSIS — M810 Age-related osteoporosis without current pathological fracture: Secondary | ICD-10-CM

## 2022-09-13 DIAGNOSIS — Z95828 Presence of other vascular implants and grafts: Secondary | ICD-10-CM

## 2022-09-13 DIAGNOSIS — Z452 Encounter for adjustment and management of vascular access device: Secondary | ICD-10-CM | POA: Insufficient documentation

## 2022-09-13 MED ORDER — SODIUM CHLORIDE 0.9% FLUSH
10.0000 mL | Freq: Once | INTRAVENOUS | Status: AC
  Administered 2022-09-13: 10 mL

## 2022-09-13 MED ORDER — HEPARIN SOD (PORK) LOCK FLUSH 100 UNIT/ML IV SOLN
500.0000 [IU] | Freq: Once | INTRAVENOUS | Status: AC
  Administered 2022-09-13: 500 [IU]

## 2022-10-10 NOTE — Progress Notes (Unsigned)
New Patient Office Visit  Subjective    Patient ID: [name], female    DOB: [birth year]  Age: 76 y.o. MRN: [id number]  CC: No chief complaint on file.   HPI [name] presents to establish care ***  Outpatient Encounter Medications as of 10/11/2022  Medication Sig   acetaminophen (TYLENOL) 500 MG tablet Take 500 mg by mouth every 6 (six) hours as needed for moderate pain or mild pain.   Cholecalciferol (VITAMIN D3) 25 MCG (1000 UT) CAPS Take by mouth daily.   lidocaine (LIDODERM) 5 % PLACE 1 PATCH ONTO THE SKIN DAILY. REMOVE & DISCARD PATCH WITHIN 12 HOURS OR AS DIRECTED BY MD   lidocaine-prilocaine (EMLA) cream APPLY TOPICALLY AS NEEDED   ondansetron (ZOFRAN ODT) 4 MG disintegrating tablet Take 1 tablet (4 mg total) by mouth every 8 (eight) hours as needed for nausea or vomiting.   polycarbophil (FIBERCON) 625 MG tablet Take 625 mg by mouth daily.   valACYclovir (VALTREX) 1000 MG tablet TAKE 1 TABLET BY MOUTH AS NEEDED AS DIRECTED BY PROVIDER. TAKES AS NEEDED   No facility-administered encounter medications on file as of 10/11/2022.    Past Medical History:  Diagnosis Date   Anemia    Anxiety    Arthritis    knee   Cataract    Colon cancer ([place])    Heart murmur    History of kidney stones     in system   Hyperlipidemia    Kidney stones    Macular degeneration    Neuromuscular disorder ([facility])    Osteoporosis    Rectal cancer ([place])    Schwannoma of spinal cord [facility])     Past Surgical History:  Procedure Laterality Date   ABDOMINAL HYSTERECTOMY     COLONOSCOPY  09/27/2021   10/07/2019  hx of rectal cancer   EYE SURGERY Bilateral    cataract   SPINE SURGERY  2017   schwannoma of spinal cord   TONSILLECTOMY     TOOTH EXTRACTION N/A 11/26/2020   Procedure: DENTAL RESTORATION/EXTRACTIONS;  Surgeon: Diona Browner, DMD;  Location: [facility];  Service: Oral Surgery;  Laterality: N/A;    Family History  Problem Relation Age of Onset    Cancer Mother        head and neck cancer    Cancer Sister        lung cancer    Cancer Brother        pancreatic cancer    Pancreatic cancer Brother    Cancer Paternal Grandmother        breast cancer    Colon cancer Neg Hx    Esophageal cancer Neg Hx    Rectal cancer Neg Hx    Stomach cancer Neg Hx    Inflammatory bowel disease Neg Hx    Liver disease Neg Hx     Social History   Socioeconomic History   Marital status: Divorced    Spouse name: Not on file   Number of children: 1   Years of education: Not on file   Highest education level: Not on file  Occupational History   Not on file  Tobacco Use   Smoking status: Former    Packs/day: 2.00    Years: 26.00    Total pack years: 52.00    Types: Cigarettes    Quit date: 11/21/1987    Years since quitting: 34.9   Smokeless tobacco: Never  Vaping Use   Vaping Use: Never used  Substance and  Sexual Activity   Alcohol use: Yes    Alcohol/week: 1.0 standard drink of alcohol    Types: 1 Glasses of wine per week    Comment: wine socially   Drug use: Yes    Types: Other-see comments    Comment: CBD   Sexual activity: Yes  Other Topics Concern   Not on file  Social History Narrative   Not on file   Social Determinants of Health   Financial Resource Strain: Not on file  Food Insecurity: Not on file  Transportation Needs: Not on file  Physical Activity: Not on file  Stress: Not on file  Social Connections: Not on file  Intimate Partner Violence: Not on file    ROS      Objective    There were no vitals taken for this visit.  Physical Exam  {Labs (Optional):23779}    Assessment & Plan:   Problem List Items Addressed This Visit   None   No follow-ups on file.   Owens Loffler, DO

## 2022-10-11 ENCOUNTER — Encounter: Payer: Self-pay | Admitting: Family Medicine

## 2022-10-11 ENCOUNTER — Ambulatory Visit: Payer: Medicaid Other | Admitting: Family Medicine

## 2022-10-11 VITALS — BP 132/79 | HR 77 | Ht 63.0 in | Wt 151.0 lb

## 2022-10-11 DIAGNOSIS — E559 Vitamin D deficiency, unspecified: Secondary | ICD-10-CM

## 2022-10-11 DIAGNOSIS — F5101 Primary insomnia: Secondary | ICD-10-CM | POA: Diagnosis not present

## 2022-10-11 DIAGNOSIS — N393 Stress incontinence (female) (male): Secondary | ICD-10-CM | POA: Diagnosis not present

## 2022-10-11 DIAGNOSIS — Z5181 Encounter for therapeutic drug level monitoring: Secondary | ICD-10-CM | POA: Diagnosis not present

## 2022-10-11 MED ORDER — DOXEPIN HCL 3 MG PO TABS: 3.0000 mg | ORAL_TABLET | Freq: Every evening | ORAL | 1 refills | Status: AC

## 2022-10-11 NOTE — Assessment & Plan Note (Signed)
-   pt says she is taking magnesium supplements. Have ordered a mag level to make sure she is not taking too much magnesium

## 2022-10-11 NOTE — Assessment & Plan Note (Signed)
-   urinary symptoms of history sounds like stress incontinence  - discussed pelvic floor exercises and provided pt with exercises
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
## 2022-10-11 NOTE — Assessment & Plan Note (Signed)
-   pt has a hx of vit d deficiency. Will go ahead and get vit d level to see if she needs supplements

## 2022-10-12 LAB — MAGNESIUM: Magnesium: 2.1 mg/dL (ref 1.5–2.5)

## 2022-10-12 LAB — VITAMIN D 25 HYDROXY (VIT D DEFICIENCY, FRACTURES): Vit D, 25-Hydroxy: 33 ng/mL (ref 30–100)

## 2022-10-18 ENCOUNTER — Telehealth: Payer: Self-pay

## 2022-10-18 ENCOUNTER — Inpatient Hospital Stay: Payer: Medicaid Other | Attending: Hematology

## 2022-10-18 DIAGNOSIS — M810 Age-related osteoporosis without current pathological fracture: Secondary | ICD-10-CM

## 2022-10-18 DIAGNOSIS — Z452 Encounter for adjustment and management of vascular access device: Secondary | ICD-10-CM | POA: Insufficient documentation

## 2022-10-18 DIAGNOSIS — Z95828 Presence of other vascular implants and grafts: Secondary | ICD-10-CM

## 2022-10-18 DIAGNOSIS — Z85048 Personal history of other malignant neoplasm of rectum, rectosigmoid junction, and anus: Secondary | ICD-10-CM | POA: Diagnosis not present

## 2022-10-18 MED ORDER — HEPARIN SOD (PORK) LOCK FLUSH 100 UNIT/ML IV SOLN
500.0000 [IU] | Freq: Once | INTRAVENOUS | Status: AC
  Administered 2022-10-18: 500 [IU]

## 2022-10-18 MED ORDER — SODIUM CHLORIDE 0.9% FLUSH
10.0000 mL | Freq: Once | INTRAVENOUS | Status: AC
  Administered 2022-10-18: 10 mL

## 2022-10-18 NOTE — Telephone Encounter (Signed)
Initiated Prior authorization MVV:[id number] HCl '3MG'$  tablets Via: Covermymeds Case/Key:[id number]  Status: approved  as of 10/18/22 Reason:Coverage Starts on: 10/18/2022 12:00:00 AM, Coverage Ends on: 10/18/2023 12:00:00 AM. Notified Pt via: Mychart

## 2022-10-18 NOTE — Telephone Encounter (Signed)
Patient states the Doxepin '3mg'$  requiring a prior authorization.  It was ordered on 10/11/22.  Would you check into his for her? Thank you :)

## 2022-10-18 NOTE — Telephone Encounter (Signed)
Patient informed. 

## 2022-10-18 NOTE — Telephone Encounter (Signed)
Patient states that Lidocaine 5% patches was removed from her medication list. She does not want this removed. States she uses these with oncologist for pain. She wanted Dr. Mel Almond to review to see if could be re-added to her med list.  She also states  that  her eye specialist( who is also pregnant) will be out of the office until March. She will plan on the left eye surgery being done in march.

## 2022-10-22 ENCOUNTER — Encounter: Payer: Self-pay | Admitting: Neurology

## 2022-10-30 NOTE — Progress Notes (Unsigned)
NEUROLOGY CONSULTATION NOTE  [name] MRN: [id number] DOB: 01/08/1946  Referring provider: Horald Pollen, MD Primary care provider: Elmo Putt, DO  Reason for consult:  dizziness, numbness  Assessment/Plan:   Dizziness - gait instability - likely secondary to macular degeneration Positional numbness and pain in legs when supine with history of lumbar degenerative spine disease and schwannoma resection Sleep paralysis, likely triggered by recent stress   1  As her legs feel heavy or weak when in bed, would check MRI of lumbar spine to evaluate for any structural etiology causing nerve root impingement 2  Start gabapentin '100mg'$  at bedtime.  We can increase dose in 2 weeks if needed. 3  Follow up in 4-5 months.   Subjective:  [name] is a 76 year old female with macular degeneration and history of rectal cancer, kidney stones and schwannoma of spinal cord who presents for dizziness and numbness.  History supplemented by referring provider's note.   Eels like drunk She began experiencing dizziness around April.  She feels like she is "drunk".  She may knock into the door frame.  She does have bilateral macular degeneration, worse in the left eye which requires surgery.  She had an MRI of the brain with and without contrast on 07/04/2022 which was personally reviewed and showed minimal chronic small vessel ischemic changes within the cerebral white matter but otherwise unremarkable.    She has chronic low back pain since resection of a schwannoma in her lumbar spine when she was living in [place].  She last had an MRI of the lumbar spine on 07/21/2020 which revealed wide laminectomy at L3-4 without evidence for residual or recurrent tumor and degenerative changes including disc protrusion at L4-5 with bilateral faoraminal narrowing, left latera disc protrusion at L2-3 withoiut significant stenosis, and moderate bilateral facet hypertrophy at L5-S1  without significant stenosis.  For the past 8 months, she notes pain and heaviness in her legs, only when laying down in bed.  She has a stabbing pain in her heels as well as pain, numbness and heaviness in the legs up to the thighs.  No new change in bowel or bladder function.  When she stands up, it immediately resolves.  For the past 2 months, she has had episodes in bed where she has numbness that travels up her body to the top of her head.  She is unable to move or talk.  Unsure how long it lasts.  It has occurred about 5 times.  She thinks she is wide awake when it occurs.  She only sleeps 4 hours a night and feels rested.  Reports increased stress.  She has remote history of sleep paralysis.      Current NSAIDS/analgesics:  acetaminophen, tramadol '50mg'$  PRN Current Antidepressant medications:  doxepin '3mg'$  QHS    PAST MEDICAL HISTORY: Past Medical History:  Diagnosis Date   Anemia    Anxiety    Arthritis    knee   Cataract    Colon cancer (HCC)    Heart murmur    History of kidney stones     in system   Hyperlipidemia    Kidney stones    Macular degeneration    Neuromuscular disorder ([place])    Osteoporosis    Rectal cancer ([place])    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[place])     PAST SURGICAL HISTORY: Past Surgical History:  Procedure Laterality Date   ABDOMINAL HYSTERECTOMY     COLONOSCOPY  09/27/2021   10/07/2019  hx of rectal cancer   EYE SURGERY Bilateral    cataract   SPINE SURGERY  2017   schwannoma of spinal cord   TONSILLECTOMY     TOOTH EXTRACTION N/A 11/26/2020   Procedure: DENTAL RESTORATION/EXTRACTIONS;  Surgeon: Diona Browner, DMD;  Location: [facility];  Service: Oral Surgery;  Laterality: N/A;    MEDICATIONS: Current Outpatient Medications on File Prior to Visit  Medication Sig Dispense Refill   acetaminophen (TYLENOL) 500 MG tablet Take 500 mg by mouth every 6 (six) hours as needed for moderate pain or mild pain.     calcium-vitamin D (OSCAL WITH D) 500-5 MG-MCG  tablet Take 1 tablet by mouth.     Cholecalciferol (VITAMIN D3) 25 MCG (1000 UT) CAPS Take by mouth daily.     Doxepin HCl 3 MG TABS Take 1 tablet (3 mg total) by mouth at bedtime. As needed for sleep. Take 30 minutes prior to bedtime 30 tablet 1   omega-3 acid ethyl esters (LOVAZA) 1 g capsule Take 1 g by mouth daily.     polycarbophil (FIBERCON) 625 MG tablet Take 625 mg by mouth daily.     traMADol (ULTRAM) 50 MG tablet Take 50 mg by mouth every 6 (six) hours as needed.     No current facility-administered medications on file prior to visit.    ALLERGIES: Allergies  Allergen Reactions   Morphine And Related Anaphylaxis   Other Other (See Comments)    Stitches reject    FAMILY HISTORY: Family History  Problem Relation Age of Onset   Cancer Mother        head and neck cancer    Cancer Sister        lung cancer    Cancer Brother        pancreatic cancer    Pancreatic cancer Brother    Cancer Paternal Grandmother        breast cancer    Colon cancer Neg Hx    Esophageal cancer Neg Hx    Rectal cancer Neg Hx    Stomach cancer Neg Hx    Inflammatory bowel disease Neg Hx    Liver disease Neg Hx     Objective:  Blood pressure (!) 152/90, pulse 74, height '5\' 3"'$  (1.6 m), weight 150 lb 12.8 oz (68.4 kg), SpO2 97 %. General: No acute distress.  Patient appears well-groomed.   Head:  Normocephalic/atraumatic Eyes:  fundi examined but not visualized Neck: supple, no paraspinal tenderness, full range of motion Back: No paraspinal tenderness Heart: regular rate and rhythm Lungs: Clear to auscultation bilaterally. Vascular: No carotid bruits. Neurological Exam: Mental status: alert and oriented to person, place, and time, speech fluent and not dysarthric, language intact. Cranial nerves: CN I: not tested CN II: pupils equal, round and reactive to light, visual fields intact CN III, IV, VI:  full range of motion, no nystagmus, no ptosis CN V: facial sensation intact. CN VII:  upper and lower face symmetric CN VIII: hearing intact CN IX, X: gag intact, uvula midline CN XI: sternocleidomastoid and trapezius muscles intact CN XII: tongue midline Bulk & Tone: normal, no fasciculations. Motor:  muscle strength 5/5 throughout Sensation:  Pinprick, temperature and vibratory sensation intact. Deep Tendon Reflexes:  2+ throughout,  toes downgoing.   Finger to nose testing:  Without dysmetria.   Heel to shin:  Without dysmetria.   Gait:  antalgic, mildly broad-based  Romberg negative.    Thank you for allowing me to take part in  the care of this patient.  Metta Clines, DO  CC:  Elmo Putt, DO  Agustina Caroli, MD

## 2022-10-31 ENCOUNTER — Encounter: Payer: Self-pay | Admitting: Neurology

## 2022-10-31 ENCOUNTER — Ambulatory Visit: Payer: Medicaid Other | Admitting: Neurology

## 2022-10-31 VITALS — BP 152/90 | HR 74 | Ht 63.0 in | Wt 150.8 lb

## 2022-10-31 DIAGNOSIS — R29898 Other symptoms and signs involving the musculoskeletal system: Secondary | ICD-10-CM

## 2022-10-31 DIAGNOSIS — M545 Low back pain, unspecified: Secondary | ICD-10-CM

## 2022-10-31 DIAGNOSIS — H3530 Unspecified macular degeneration: Secondary | ICD-10-CM

## 2022-10-31 DIAGNOSIS — G478 Other sleep disorders: Secondary | ICD-10-CM

## 2022-10-31 DIAGNOSIS — R42 Dizziness and giddiness: Secondary | ICD-10-CM | POA: Diagnosis not present

## 2022-10-31 MED ORDER — GABAPENTIN 100 MG PO CAPS: 100.0000 mg | ORAL_CAPSULE | Freq: Every day | ORAL | 5 refills | Status: AC

## 2022-10-31 NOTE — Patient Instructions (Signed)
Start gabapentin '100mg'$  at bedtime.  We can increase dose in 2 weeks if needed MRI of lumbar spine Follow up 4 to 5 months

## 2022-11-15 ENCOUNTER — Telehealth: Payer: Self-pay | Admitting: Hematology

## 2022-11-15 ENCOUNTER — Inpatient Hospital Stay: Payer: Medicaid Other

## 2022-11-15 NOTE — Telephone Encounter (Signed)
Patient called to r/s appointment due to illness. Patient notified of new appointment date/time

## 2022-11-21 ENCOUNTER — Inpatient Hospital Stay: Payer: Medicaid Other | Attending: Hematology

## 2022-11-21 DIAGNOSIS — Z452 Encounter for adjustment and management of vascular access device: Secondary | ICD-10-CM | POA: Insufficient documentation

## 2022-11-21 DIAGNOSIS — Z85048 Personal history of other malignant neoplasm of rectum, rectosigmoid junction, and anus: Secondary | ICD-10-CM | POA: Insufficient documentation

## 2022-11-21 DIAGNOSIS — Z95828 Presence of other vascular implants and grafts: Secondary | ICD-10-CM

## 2022-11-21 DIAGNOSIS — M810 Age-related osteoporosis without current pathological fracture: Secondary | ICD-10-CM

## 2022-11-21 MED ORDER — SODIUM CHLORIDE 0.9% FLUSH
10.0000 mL | Freq: Once | INTRAVENOUS | Status: AC
  Administered 2022-11-21: 10 mL

## 2022-11-21 MED ORDER — HEPARIN SOD (PORK) LOCK FLUSH 100 UNIT/ML IV SOLN
500.0000 [IU] | Freq: Once | INTRAVENOUS | Status: AC
  Administered 2022-11-21: 500 [IU]

## 2022-11-29 ENCOUNTER — Ambulatory Visit: Payer: Medicaid Other | Admitting: Neurology

## 2022-12-05 ENCOUNTER — Ambulatory Visit: Payer: Medicaid Other | Admitting: Neurology

## 2022-12-12 ENCOUNTER — Encounter: Payer: Self-pay | Admitting: Neurology

## 2022-12-12 NOTE — Progress Notes (Unsigned)
[facility]   [phone] [phone]   Clinic Follow up Note   Patient Care Team: Owens Loffler, DO as PCP - General (Family Medicine) Truitt Merle, MD as PCP - Hematology/Oncology (Hematology) Mansouraty, Telford Nab., MD as Consulting Physician (Gastroenterology)  Date of Service:  12/13/2022  CHIEF COMPLAINT: f/u of rectal cancer    CURRENT THERAPY:  Surveillance   ASSESSMENT:  [name] is a 77 y.o. female with   Rectal cancer ([place]) -Diagnosed 04/2017, treated with concurrent chemo RT and consolidation 5-FU/leucovorin for 3 months.  She declined surgery.  On surveillance -most recent surveillance colonoscopy on 09/27/21 by Dr. Rush Landmark showed hemorrhoids, one polyp, and a few scattered diverticulosis throughout colon. The recommendation is for repeat in 1-2 years. -surveillance CT CAP 02/07/22 showed NED. -she reports a number of new developments-- one instance of chest pain (lasting 10 min) yesterday, episodes of dizziness, and irregular bowel movement, especially diarrhea.  Exam including rectal exam was unremarkable.  I do not have high clinical suspicion for cancer recurrence. -Port in place, continue flushes every 4 weeks (due to prior issues) she strongly prefers to keep her port. ("It's psychological," she states) -She is clinically doing well, lab reviewed, exam unremarkable, patient declined rectal exam today.  No clinical concern for recurrence -She has moved to [facility], she would like to follow-up with medical oncologist in [place].  She has been 5 years out since her initial diagnosis, I reassured her she can be discharged from oncology service.  However patient insist to see medical oncologist for follow-up.  She also need a port flush every 6 to 8 weeks.    PLAN: -LAB REVIEWED - referral medical Oncologist in [place] per pt's request for long term follow up, she also needs port flush  -follow up with Dr.  Rush Landmark for colonoscopy.     SUMMARY OF ONCOLOGIC HISTORY: Oncology History Overview Note  Cancer Staging Rectal cancer [facility]) Staging form: Colon and Rectum, AJCC 8th Edition - Clinical stage from 05/11/2017: cT3, cN2, cM0 - Signed by Truitt Merle, MD on 08/25/2019    Rectal cancer ([place])  05/11/2017 Cancer Staging   Staging form: Colon and Rectum, AJCC 8th Edition - Clinical stage from 05/11/2017: cT3, cN2, cM0 - Signed by Truitt Merle, MD on 08/25/2019   05/11/2017 Procedure   Colonoscopy by Dr Crissie Reese at [facility] -Palpable rectal mass found on digital rectal exam  -Likely malignant tumor in the rectum. Biopsied.  -One 6 mm polyp in the sigmoid colon, removed with a cold snare, resected and retrieved.  -Diverticulosis at the hepatic flexure and in the ascending colon -the examined portion of the ileum was normal.    09/10/2017 Initial Biopsy   Final Diagnosis  Sigmoid Polyp biopsies:  Tubular Adenoma  2. Rectal Mass biopsies:  Moderately differentiated adenocarcinoma, invasive.  The depth of the invasion cannot be assessed in this biopsy specimen.     - 06/29/2017 Chemotherapy   She received neoadjuvant infusional 5FU with concurrent radiation.     09/17/2017 Procedure   Sigmoidoscopy by Dr. Tami Ribas at [facility] on 09/17/17  was noted to have complete response from chemoRT   10/31/2017 - 01/2018 Chemotherapy   Consolidation Chemo 5FU and Leucovorin. Oxaliplatin was deferred due to pre-existing neuropathy. Pt declined rectal surgery.    01/13/2019 Imaging   CT CAP W Contrast at [facility] show persistent and stable 27m nodule in the right middle lobe and stable 223m  nodule in the superior segment of the left lower lobe. No new or suspect nodules   Cholelithiasis without evidence of cholecystitis.   Mild compression of the superior plate of L5, Stable   Overall no evidence of disease recurrence in the chest, abdomen or pelvis.     08/25/2019 Initial Diagnosis   Rectal cancer ([place])   09/08/2019 Imaging   CT CAP W contrast 09/08/19  IMPRESSION: 1. Areas of mild rectal wall thickening, likely radiation change. No discrete rectal mass is identified. No perirectal or pelvic or retroperitoneal lymphadenopathy. 2. No findings suspicious for metastatic disease involving the liver or lungs. 3. Cholelithiasis.   10/07/2019 Procedure   Colonoscopy by Dr. Silvestre Moment 10/07/19 IMPRESSION - Hemorrhoids found on digital rectal exam. - The examined portion of the ileum was normal. - One 4 mm polyp in the ascending colon, removed with a cold snare. Resected and retrieved. - A single colonic angioectasia. - Scar in the distal rectum. - Diverticulosis in the recto-sigmoid colon, in the sigmoid colon, in the descending colon and at the hepatic flexure. - Normal mucosa in the entire examined colon otherwise. - Non-bleeding non-thrombosed external and internal hemorrhoids. Diagnosis Surgical [P], colon, ascending, polyp - BENIGN COLONIC MUCOSA WITH LYMPHOID AGGREGATE. - NO DYSPLASIA OR MALIGNANCY.   06/03/2020 Imaging   MRI Pelvis  IMPRESSION: No evidence of residual/recurrent or metastatic disease within the pelvis.   10/27/2020 Procedure   Colonoscopy by Dr Rush Landmark  IMPRESSION - Hemorrhoids found on digital rectal exam. - Two 2 to 3 mm polyps in the ascending colon and in the cecum. - Normal mucosa in the entire examined colon. - Diverticulosis in the recto-sigmoid colon and in the sigmoid colon. - Non-bleeding non-thrombosed external and internal hemorrhoids.     Diagnosis Surgical [P], colon, ascending, cecum, polyps (2) - TUBULAR ADENOMA (1 FRAGMENTS) - MULTIPLE FRAGMENTS OF BENIGN COLONIC MUCOSA - NO HIGH GRADE DYSPLASIA OR MALIGNANCY IDENTIFIED   02/03/2021 Imaging   CT CAP  IMPRESSION: 1. Post treatment change in the rectal wall without overt mass like lesion. 2. No evidence of metastatic disease  within the chest abdomen or pelvis. 3. Hepatic steatosis. 4. Cholelithiasis. 5. Aortic atherosclerosis.   Aortic Atherosclerosis (ICD10-I70.0).       INTERVAL HISTORY:  Autum Benfer is here for a follow up of  rectal cancer  She was last seen by me on 70/26/2023 She presents to the clinic accompanied by husband. Pt states she has moved to [place], [place] and needs a referral. Pt states she has no problem with BM , but has some diarrhea  every now and then. Pt reports of having a lot of dizziness.   All other systems were reviewed with the patient and are negative.  MEDICAL HISTORY:  Past Medical History:  Diagnosis Date   Anemia    Anxiety    Arthritis    knee   Cataract    Colon cancer ([place])    Heart murmur    History of kidney stones     in system   Hyperlipidemia    Kidney stones    Macular degeneration    Neuromuscular disorder ([place])    Osteoporosis    Rectal cancer ([place])    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[place])     SURGICAL HISTORY: Past Surgical History:  Procedure Laterality Date   ABDOMINAL HYSTERECTOMY     COLONOSCOPY  09/27/2021   10/07/2019  hx of rectal cancer   EYE SURGERY Bilateral    cataract  SPINE SURGERY  2017   schwannoma of spinal cord   TONSILLECTOMY     TOOTH EXTRACTION N/A 11/26/2020   Procedure: DENTAL RESTORATION/EXTRACTIONS;  Surgeon: Diona Browner, DMD;  Location: [facility];  Service: Oral Surgery;  Laterality: N/A;    I have reviewed the social history and family history with the patient and they are unchanged from previous note.  ALLERGIES:  is allergic to morphine and related and other.  MEDICATIONS:  Current Outpatient Medications  Medication Sig Dispense Refill   acetaminophen (TYLENOL) 500 MG tablet Take 500 mg by mouth every 6 (six) hours as needed for moderate pain or mild pain.     calcium-vitamin D (OSCAL WITH D) 500-5 MG-MCG tablet Take 1 tablet by mouth. (Patient not taking: Reported on 10/31/2022)     Cholecalciferol  (VITAMIN D3) 25 MCG (1000 UT) CAPS Take by mouth daily. (Patient not taking: Reported on 10/31/2022)     Doxepin HCl 3 MG TABS Take 1 tablet (3 mg total) by mouth at bedtime. As needed for sleep. Take 30 minutes prior to bedtime 30 tablet 1   gabapentin (NEURONTIN) 100 MG capsule Take 1 capsule (100 mg total) by mouth at bedtime. 30 capsule 5   omega-3 acid ethyl esters (LOVAZA) 1 g capsule Take 1 g by mouth daily.     polycarbophil (FIBERCON) 625 MG tablet Take 625 mg by mouth daily. (Patient not taking: Reported on 10/31/2022)     traMADol (ULTRAM) 50 MG tablet Take 50 mg by mouth every 6 (six) hours as needed.     No current facility-administered medications for this visit.    PHYSICAL EXAMINATION: ECOG PERFORMANCE STATUS: 0 - Asymptomatic  Vitals:   12/13/22 0917  BP: (!) 148/71  Pulse: 66  Resp: 18  Temp: 98.1 F (36.7 C)  SpO2: 99%   Wt Readings from Last 3 Encounters:  12/13/22 150 lb 14.4 oz (68.4 kg)  10/31/22 150 lb 12.8 oz (68.4 kg)  10/11/22 151 lb (68.5 kg)     GENERAL:alert, no distress and comfortable SKIN: skin color, texture, turgor are normal, no rashes or significant lesions EYES: normal, Conjunctiva are pink and non-injected, sclera clear NECK: (-) supple, thyroid normal size, non-tender, without nodularity LYMPH:(-)   no palpable lymphadenopathy in the cervical, axillary (-)or inguinal LUNGS: (-)clear to auscultation and percussion with normal breathing effort HEART:(-) regular rate & rhythm and no murmurs and no lower extremity edema ABDOMEN:(-)abdomen soft, non-tender and normal bowel sounds Musculoskeletal:no cyanosis of digits and no clubbing  NEURO: alert & oriented x 3 with fluent speech, no focal motor/sensory deficits      LABORATORY DATA:  I have reviewed the data as listed    Latest Ref Rng & Units 12/13/2022    8:51 AM 06/14/2022   10:02 AM 12/14/2021    9:32 AM  CBC  WBC 4.0 - 10.5 K/uL 4.2  5.0  4.0   Hemoglobin 12.0 - 15.0 g/dL 12.5   12.4  11.7   Hematocrit 36.0 - 46.0 % 36.0  36.3  34.6   Platelets 150 - 400 K/uL 259  243  233         Latest Ref Rng & Units 12/13/2022    8:51 AM 06/14/2022   10:02 AM 12/14/2021    9:32 AM  CMP  Glucose 70 - 99 mg/dL 89  105  93   BUN 8 - 23 mg/dL '12  15  12   '$ Creatinine 0.44 - 1.00 mg/dL 0.71  0.71  0.72   Sodium 135 - 145 mmol/L 140  138  139   Potassium 3.5 - 5.1 mmol/L 4.2  4.2  3.9   Chloride 98 - 111 mmol/L 107  108  107   CO2 22 - 32 mmol/L '28  26  27   '$ Calcium 8.9 - 10.3 mg/dL 10.1  9.8  10.0   Total Protein 6.5 - 8.1 g/dL 6.8  6.8  6.7   Total Bilirubin 0.3 - 1.2 mg/dL 0.6  0.4  0.8   Alkaline Phos 38 - 126 U/L 39  39  36   AST 15 - 41 U/L '14  14  14   '$ ALT 0 - 44 U/L '8  9  11       '$ RADIOGRAPHIC STUDIES: I have personally reviewed the radiological images as listed and agreed with the findings in the report. No results found.    Orders Placed This Encounter  Procedures   Ambulatory referral to Hematology / Oncology    Referral Priority:   Routine    Referral Type:   Consultation    Referral Reason:   Specialty Services Required    Requested Specialty:   Oncology    Number of Visits Requested:   1   All questions were answered. The patient knows to call the clinic with any problems, questions or concerns. No barriers to learning was detected. The total time spent in the appointment was 20 minutes.     Truitt Merle, MD 12/13/2022   Felicity Coyer, CMA, am acting as scribe for Truitt Merle, MD.   I have reviewed the above documentation for accuracy and completeness, and I agree with the above.

## 2022-12-12 NOTE — Assessment & Plan Note (Signed)
-  Diagnosed 04/2017, treated with concurrent chemo RT and consolidation 5-FU/leucovorin for 3 months.  She declined surgery.  On surveillance -most recent surveillance colonoscopy on 09/27/21 by Dr. Rush Landmark showed hemorrhoids, one polyp, and a few scattered diverticulosis throughout colon. The recommendation is for repeat in 1-2 years. -surveillance CT CAP 02/07/22 showed NED. -she reports a number of new developments-- one instance of chest pain (lasting 10 min) yesterday, episodes of dizziness, and irregular bowel movement, especially diarrhea.  Exam including rectal exam was unremarkable.  I do not have high clinical suspicion for cancer recurrence. -Port in place, continue flushes every 4 weeks (due to prior issues) she strongly prefers to keep her port. ("It's psychological," she states)

## 2022-12-13 ENCOUNTER — Inpatient Hospital Stay: Payer: Medicaid Other

## 2022-12-13 ENCOUNTER — Encounter: Payer: Self-pay | Admitting: Hematology

## 2022-12-13 ENCOUNTER — Other Ambulatory Visit: Payer: Self-pay

## 2022-12-13 ENCOUNTER — Inpatient Hospital Stay: Payer: Medicaid Other | Admitting: Hematology

## 2022-12-13 ENCOUNTER — Other Ambulatory Visit: Payer: Medicaid Other

## 2022-12-13 VITALS — BP 148/71 | HR 66 | Temp 98.1°F | Resp 18 | Ht 63.0 in | Wt 150.9 lb

## 2022-12-13 DIAGNOSIS — C20 Malignant neoplasm of rectum: Secondary | ICD-10-CM

## 2022-12-13 DIAGNOSIS — Z452 Encounter for adjustment and management of vascular access device: Secondary | ICD-10-CM | POA: Diagnosis not present

## 2022-12-13 DIAGNOSIS — M810 Age-related osteoporosis without current pathological fracture: Secondary | ICD-10-CM

## 2022-12-13 DIAGNOSIS — Z95828 Presence of other vascular implants and grafts: Secondary | ICD-10-CM

## 2022-12-13 DIAGNOSIS — Z85048 Personal history of other malignant neoplasm of rectum, rectosigmoid junction, and anus: Secondary | ICD-10-CM | POA: Diagnosis not present

## 2022-12-13 LAB — CMP (CANCER CENTER ONLY)
ALT: 8 U/L (ref 0–44)
AST: 14 U/L — ABNORMAL LOW (ref 15–41)
Albumin: 4 g/dL (ref 3.5–5.0)
Alkaline Phosphatase: 39 U/L (ref 38–126)
Anion gap: 5 (ref 5–15)
BUN: 12 mg/dL (ref 8–23)
CO2: 28 mmol/L (ref 22–32)
Calcium: 10.1 mg/dL (ref 8.9–10.3)
Chloride: 107 mmol/L (ref 98–111)
Creatinine: 0.71 mg/dL (ref 0.44–1.00)
GFR, Estimated: >60 mL/min (ref >60)
Glucose, Bld: 89 mg/dL (ref 70–99)
Potassium: 4.2 mmol/L (ref 3.5–5.1)
Sodium: 140 mmol/L (ref 135–145)
Total Bilirubin: 0.6 mg/dL (ref 0.3–1.2)
Total Protein: 6.8 g/dL (ref 6.5–8.1)

## 2022-12-13 LAB — CBC WITH DIFFERENTIAL (CANCER CENTER ONLY)
Abs Immature Granulocytes: 0.01 10*3/uL (ref 0.00–0.07)
Basophils Absolute: 0 10*3/uL (ref 0.0–0.1)
Basophils Relative: 1 %
Eosinophils Absolute: 0.1 10*3/uL (ref 0.0–0.5)
Eosinophils Relative: 2 %
HCT: 36 % (ref 36.0–46.0)
Hemoglobin: 12.5 g/dL (ref 12.0–15.0)
Immature Granulocytes: 0 %
Lymphocytes Relative: 27 %
Lymphs Abs: 1.1 10*3/uL (ref 0.7–4.0)
MCH: 29.4 pg (ref 26.0–34.0)
MCHC: 34.7 g/dL (ref 30.0–36.0)
MCV: 84.7 fL (ref 80.0–100.0)
Monocytes Absolute: 0.4 10*3/uL (ref 0.1–1.0)
Monocytes Relative: 10 %
Neutro Abs: 2.5 10*3/uL (ref 1.7–7.7)
Neutrophils Relative %: 60 %
Platelet Count: 259 10*3/uL (ref 150–400)
RBC: 4.25 MIL/uL (ref 3.87–5.11)
RDW: 13.4 % (ref 11.5–15.5)
WBC Count: 4.2 10*3/uL (ref 4.0–10.5)
nRBC: 0 % (ref 0.0–0.2)

## 2022-12-13 LAB — CEA (IN HOUSE-CHCC): CEA (CHCC-In House): <1 ng/mL (ref 0.00–5.00)

## 2022-12-13 MED ORDER — HEPARIN SOD (PORK) LOCK FLUSH 100 UNIT/ML IV SOLN
500.0000 [IU] | Freq: Once | INTRAVENOUS | Status: AC
  Administered 2022-12-13: 500 [IU]

## 2022-12-13 MED ORDER — SODIUM CHLORIDE 0.9% FLUSH
10.0000 mL | Freq: Once | INTRAVENOUS | Status: AC
  Administered 2022-12-13: 10 mL

## 2022-12-13 NOTE — Progress Notes (Signed)
Referral to [facility] was sent via fax [phone]  [phone] per Dr. Ernestina Penna request.  Pt requesting to be referred to [facility] d/t location is closer to pt's home.  Referral order, office note, CT results, pathology, and pt demographics sent.  Contacted Margit Hanks in Radiology to push images over in EPIC to [facility].  Fax confirmation received.

## 2022-12-14 ENCOUNTER — Telehealth: Payer: Self-pay

## 2022-12-14 ENCOUNTER — Encounter: Payer: Self-pay | Admitting: Hematology

## 2022-12-14 NOTE — Telephone Encounter (Signed)
Pt called stating that she read Dr. Ernestina Penna office note from clinic visit on 12/13/2022 and Dr. Burr Medico wrote that pt was married (Husband).  Pt is requesting if that could be corrected because the pt stated she is not married.  Pt would like for the note to be corrected because that might complicate things for her because she is not married.  Notified Dr. Burr Medico of the pt's request.

## 2022-12-15 NOTE — Progress Notes (Signed)
Spoke with pt via telephone to inform pt that Dr. Burr Medico updated her office note to remove "husband" because pt is not married.  Pt was Thankful and had no further questions or concerns.

## 2023-01-01 DIAGNOSIS — Z885 Allergy status to narcotic agent status: Secondary | ICD-10-CM | POA: Diagnosis not present

## 2023-01-01 DIAGNOSIS — C20 Malignant neoplasm of rectum: Secondary | ICD-10-CM | POA: Diagnosis not present

## 2023-01-04 DIAGNOSIS — Z1231 Encounter for screening mammogram for malignant neoplasm of breast: Secondary | ICD-10-CM | POA: Diagnosis not present

## 2023-01-04 DIAGNOSIS — N644 Mastodynia: Secondary | ICD-10-CM | POA: Diagnosis not present

## 2023-01-04 DIAGNOSIS — M545 Low back pain, unspecified: Secondary | ICD-10-CM | POA: Diagnosis not present

## 2023-01-04 DIAGNOSIS — Z86018 Personal history of other benign neoplasm: Secondary | ICD-10-CM | POA: Diagnosis not present

## 2023-01-04 DIAGNOSIS — G8929 Other chronic pain: Secondary | ICD-10-CM | POA: Diagnosis not present

## 2023-01-25 DIAGNOSIS — R29898 Other symptoms and signs involving the musculoskeletal system: Secondary | ICD-10-CM | POA: Diagnosis not present

## 2023-01-25 DIAGNOSIS — M79671 Pain in right foot: Secondary | ICD-10-CM | POA: Diagnosis not present

## 2023-01-25 DIAGNOSIS — M545 Low back pain, unspecified: Secondary | ICD-10-CM | POA: Diagnosis not present

## 2023-01-25 DIAGNOSIS — G8929 Other chronic pain: Secondary | ICD-10-CM | POA: Diagnosis not present

## 2023-01-25 DIAGNOSIS — M79672 Pain in left foot: Secondary | ICD-10-CM | POA: Diagnosis not present

## 2023-01-25 DIAGNOSIS — Z86018 Personal history of other benign neoplasm: Secondary | ICD-10-CM | POA: Diagnosis not present

## 2023-01-25 DIAGNOSIS — G629 Polyneuropathy, unspecified: Secondary | ICD-10-CM | POA: Diagnosis not present

## 2023-02-05 DIAGNOSIS — M5412 Radiculopathy, cervical region: Secondary | ICD-10-CM | POA: Diagnosis not present

## 2023-02-05 DIAGNOSIS — M5417 Radiculopathy, lumbosacral region: Secondary | ICD-10-CM | POA: Diagnosis not present

## 2023-03-01 DIAGNOSIS — R110 Nausea: Secondary | ICD-10-CM | POA: Diagnosis not present

## 2023-03-01 DIAGNOSIS — R197 Diarrhea, unspecified: Secondary | ICD-10-CM | POA: Diagnosis not present

## 2023-03-05 DIAGNOSIS — R197 Diarrhea, unspecified: Secondary | ICD-10-CM | POA: Diagnosis not present

## 2023-03-21 ENCOUNTER — Ambulatory Visit: Payer: Medicaid Other | Admitting: Neurology

## 2023-03-26 DIAGNOSIS — H34832 Tributary (branch) retinal vein occlusion, left eye, with macular edema: Secondary | ICD-10-CM | POA: Diagnosis not present

## 2023-03-26 DIAGNOSIS — Z961 Presence of intraocular lens: Secondary | ICD-10-CM | POA: Diagnosis not present

## 2023-03-26 DIAGNOSIS — H35371 Puckering of macula, right eye: Secondary | ICD-10-CM | POA: Diagnosis not present

## 2023-03-26 DIAGNOSIS — H43813 Vitreous degeneration, bilateral: Secondary | ICD-10-CM | POA: Diagnosis not present

## 2023-04-12 DIAGNOSIS — M4802 Spinal stenosis, cervical region: Secondary | ICD-10-CM | POA: Diagnosis not present

## 2023-04-12 DIAGNOSIS — D334 Benign neoplasm of spinal cord: Secondary | ICD-10-CM | POA: Diagnosis not present

## 2023-04-12 DIAGNOSIS — S3210XA Unspecified fracture of sacrum, initial encounter for closed fracture: Secondary | ICD-10-CM | POA: Diagnosis not present

## 2023-04-12 DIAGNOSIS — M8000XS Age-related osteoporosis with current pathological fracture, unspecified site, sequela: Secondary | ICD-10-CM | POA: Diagnosis not present

## 2023-04-12 DIAGNOSIS — K2289 Other specified disease of esophagus: Secondary | ICD-10-CM | POA: Diagnosis not present

## 2023-04-18 DIAGNOSIS — H35373 Puckering of macula, bilateral: Secondary | ICD-10-CM | POA: Diagnosis not present

## 2023-04-18 DIAGNOSIS — Z961 Presence of intraocular lens: Secondary | ICD-10-CM | POA: Diagnosis not present

## 2023-04-18 DIAGNOSIS — H34832 Tributary (branch) retinal vein occlusion, left eye, with macular edema: Secondary | ICD-10-CM | POA: Diagnosis not present

## 2023-04-18 DIAGNOSIS — H43813 Vitreous degeneration, bilateral: Secondary | ICD-10-CM | POA: Diagnosis not present

## 2023-04-20 DIAGNOSIS — K2289 Other specified disease of esophagus: Secondary | ICD-10-CM | POA: Diagnosis not present

## 2023-04-23 DIAGNOSIS — M48061 Spinal stenosis, lumbar region without neurogenic claudication: Secondary | ICD-10-CM | POA: Diagnosis not present

## 2023-04-23 DIAGNOSIS — M4312 Spondylolisthesis, cervical region: Secondary | ICD-10-CM | POA: Diagnosis not present

## 2023-04-23 DIAGNOSIS — M25511 Pain in right shoulder: Secondary | ICD-10-CM | POA: Diagnosis not present

## 2023-04-23 DIAGNOSIS — M4802 Spinal stenosis, cervical region: Secondary | ICD-10-CM | POA: Diagnosis not present

## 2023-04-23 DIAGNOSIS — G8929 Other chronic pain: Secondary | ICD-10-CM | POA: Diagnosis not present

## 2023-04-23 DIAGNOSIS — D334 Benign neoplasm of spinal cord: Secondary | ICD-10-CM | POA: Diagnosis not present

## 2023-05-14 DIAGNOSIS — H34832 Tributary (branch) retinal vein occlusion, left eye, with macular edema: Secondary | ICD-10-CM | POA: Diagnosis not present

## 2023-05-14 DIAGNOSIS — R933 Abnormal findings on diagnostic imaging of other parts of digestive tract: Secondary | ICD-10-CM | POA: Diagnosis not present

## 2023-05-14 DIAGNOSIS — K2289 Other specified disease of esophagus: Secondary | ICD-10-CM | POA: Diagnosis not present

## 2023-05-14 DIAGNOSIS — R9389 Abnormal findings on diagnostic imaging of other specified body structures: Secondary | ICD-10-CM | POA: Diagnosis not present

## 2023-05-14 DIAGNOSIS — Z87891 Personal history of nicotine dependence: Secondary | ICD-10-CM | POA: Diagnosis not present

## 2023-05-22 DIAGNOSIS — M542 Cervicalgia: Secondary | ICD-10-CM | POA: Diagnosis not present

## 2023-05-22 DIAGNOSIS — M4802 Spinal stenosis, cervical region: Secondary | ICD-10-CM | POA: Diagnosis not present

## 2023-05-22 DIAGNOSIS — G8929 Other chronic pain: Secondary | ICD-10-CM | POA: Diagnosis not present

## 2023-05-22 DIAGNOSIS — M792 Neuralgia and neuritis, unspecified: Secondary | ICD-10-CM | POA: Diagnosis not present

## 2023-05-22 DIAGNOSIS — D3610 Benign neoplasm of peripheral nerves and autonomic nervous system, unspecified: Secondary | ICD-10-CM | POA: Diagnosis not present

## 2023-05-22 DIAGNOSIS — M545 Low back pain, unspecified: Secondary | ICD-10-CM | POA: Diagnosis not present

## 2023-05-23 DIAGNOSIS — M25572 Pain in left ankle and joints of left foot: Secondary | ICD-10-CM | POA: Diagnosis not present

## 2023-05-30 DIAGNOSIS — M25511 Pain in right shoulder: Secondary | ICD-10-CM | POA: Diagnosis not present

## 2023-05-30 DIAGNOSIS — M6289 Other specified disorders of muscle: Secondary | ICD-10-CM | POA: Diagnosis not present

## 2023-05-30 DIAGNOSIS — S46811A Strain of other muscles, fascia and tendons at shoulder and upper arm level, right arm, initial encounter: Secondary | ICD-10-CM | POA: Diagnosis not present

## 2023-05-30 DIAGNOSIS — M7531 Calcific tendinitis of right shoulder: Secondary | ICD-10-CM | POA: Diagnosis not present

## 2023-05-30 DIAGNOSIS — M7551 Bursitis of right shoulder: Secondary | ICD-10-CM | POA: Diagnosis not present

## 2023-05-30 DIAGNOSIS — M67813 Other specified disorders of tendon, right shoulder: Secondary | ICD-10-CM | POA: Diagnosis not present

## 2023-05-30 DIAGNOSIS — M75121 Complete rotator cuff tear or rupture of right shoulder, not specified as traumatic: Secondary | ICD-10-CM | POA: Diagnosis not present

## 2023-05-30 DIAGNOSIS — Z95828 Presence of other vascular implants and grafts: Secondary | ICD-10-CM | POA: Diagnosis not present

## 2023-05-30 DIAGNOSIS — M25811 Other specified joint disorders, right shoulder: Secondary | ICD-10-CM | POA: Diagnosis not present

## 2023-05-30 DIAGNOSIS — M19011 Primary osteoarthritis, right shoulder: Secondary | ICD-10-CM | POA: Diagnosis not present

## 2023-05-31 DIAGNOSIS — H35371 Puckering of macula, right eye: Secondary | ICD-10-CM | POA: Diagnosis not present

## 2023-05-31 DIAGNOSIS — Z13220 Encounter for screening for lipoid disorders: Secondary | ICD-10-CM | POA: Diagnosis not present

## 2023-05-31 DIAGNOSIS — K2289 Other specified disease of esophagus: Secondary | ICD-10-CM | POA: Diagnosis not present

## 2023-05-31 DIAGNOSIS — Z13228 Encounter for screening for other metabolic disorders: Secondary | ICD-10-CM | POA: Diagnosis not present

## 2023-05-31 DIAGNOSIS — H34832 Tributary (branch) retinal vein occlusion, left eye, with macular edema: Secondary | ICD-10-CM | POA: Diagnosis not present

## 2023-06-06 DIAGNOSIS — Z79899 Other long term (current) drug therapy: Secondary | ICD-10-CM | POA: Diagnosis not present

## 2023-06-06 DIAGNOSIS — C20 Malignant neoplasm of rectum: Secondary | ICD-10-CM | POA: Diagnosis not present

## 2023-06-06 DIAGNOSIS — K2289 Other specified disease of esophagus: Secondary | ICD-10-CM | POA: Diagnosis not present

## 2023-06-12 DIAGNOSIS — H5213 Myopia, bilateral: Secondary | ICD-10-CM | POA: Diagnosis not present

## 2023-06-22 DIAGNOSIS — N888 Other specified noninflammatory disorders of cervix uteri: Secondary | ICD-10-CM | POA: Diagnosis not present

## 2023-07-09 DIAGNOSIS — C20 Malignant neoplasm of rectum: Secondary | ICD-10-CM | POA: Diagnosis not present

## 2023-07-09 DIAGNOSIS — N888 Other specified noninflammatory disorders of cervix uteri: Secondary | ICD-10-CM | POA: Diagnosis not present

## 2023-07-26 DIAGNOSIS — C20 Malignant neoplasm of rectum: Secondary | ICD-10-CM | POA: Diagnosis not present

## 2023-07-26 DIAGNOSIS — Z452 Encounter for adjustment and management of vascular access device: Secondary | ICD-10-CM | POA: Diagnosis not present

## 2023-08-07 DIAGNOSIS — Z5189 Encounter for other specified aftercare: Secondary | ICD-10-CM | POA: Diagnosis not present

## 2023-08-17 DIAGNOSIS — G8918 Other acute postprocedural pain: Secondary | ICD-10-CM | POA: Diagnosis not present

## 2023-08-20 DIAGNOSIS — R0789 Other chest pain: Secondary | ICD-10-CM | POA: Diagnosis not present

## 2023-08-20 DIAGNOSIS — G8918 Other acute postprocedural pain: Secondary | ICD-10-CM | POA: Diagnosis not present

## 2023-08-31 DIAGNOSIS — Z9889 Other specified postprocedural states: Secondary | ICD-10-CM | POA: Diagnosis not present

## 2023-08-31 DIAGNOSIS — M96842 Postprocedural seroma of a musculoskeletal structure following a musculoskeletal system procedure: Secondary | ICD-10-CM | POA: Diagnosis not present

## 2023-09-14 DIAGNOSIS — M96842 Postprocedural seroma of a musculoskeletal structure following a musculoskeletal system procedure: Secondary | ICD-10-CM | POA: Diagnosis not present

## 2023-09-14 DIAGNOSIS — Z9889 Other specified postprocedural states: Secondary | ICD-10-CM | POA: Diagnosis not present

## 2023-09-26 DIAGNOSIS — G8929 Other chronic pain: Secondary | ICD-10-CM | POA: Diagnosis not present

## 2023-09-26 DIAGNOSIS — M545 Low back pain, unspecified: Secondary | ICD-10-CM | POA: Diagnosis not present

## 2023-09-26 DIAGNOSIS — M5417 Radiculopathy, lumbosacral region: Secondary | ICD-10-CM | POA: Diagnosis not present

## 2023-09-26 DIAGNOSIS — D3610 Benign neoplasm of peripheral nerves and autonomic nervous system, unspecified: Secondary | ICD-10-CM | POA: Diagnosis not present

## 2023-10-30 DIAGNOSIS — M545 Low back pain, unspecified: Secondary | ICD-10-CM | POA: Diagnosis not present

## 2023-12-17 DIAGNOSIS — Z86018 Personal history of other benign neoplasm: Secondary | ICD-10-CM | POA: Diagnosis not present

## 2023-12-17 DIAGNOSIS — C20 Malignant neoplasm of rectum: Secondary | ICD-10-CM | POA: Diagnosis not present

## 2023-12-17 DIAGNOSIS — Z85048 Personal history of other malignant neoplasm of rectum, rectosigmoid junction, and anus: Secondary | ICD-10-CM | POA: Diagnosis not present

## 2023-12-17 DIAGNOSIS — Z9221 Personal history of antineoplastic chemotherapy: Secondary | ICD-10-CM | POA: Diagnosis not present

## 2023-12-17 DIAGNOSIS — K2289 Other specified disease of esophagus: Secondary | ICD-10-CM | POA: Diagnosis not present

## 2023-12-17 DIAGNOSIS — R9389 Abnormal findings on diagnostic imaging of other specified body structures: Secondary | ICD-10-CM | POA: Diagnosis not present

## 2023-12-17 DIAGNOSIS — Z08 Encounter for follow-up examination after completed treatment for malignant neoplasm: Secondary | ICD-10-CM | POA: Diagnosis not present

## 2023-12-20 ENCOUNTER — Other Ambulatory Visit: Payer: Self-pay | Admitting: Emergency Medicine

## 2024-01-09 DIAGNOSIS — A6000 Herpesviral infection of urogenital system, unspecified: Secondary | ICD-10-CM | POA: Diagnosis not present

## 2024-01-09 DIAGNOSIS — R7303 Prediabetes: Secondary | ICD-10-CM | POA: Diagnosis not present

## 2024-01-10 DIAGNOSIS — G8929 Other chronic pain: Secondary | ICD-10-CM | POA: Diagnosis not present

## 2024-01-10 DIAGNOSIS — M4802 Spinal stenosis, cervical region: Secondary | ICD-10-CM | POA: Diagnosis not present

## 2024-01-10 DIAGNOSIS — K2289 Other specified disease of esophagus: Secondary | ICD-10-CM | POA: Diagnosis not present

## 2024-01-10 DIAGNOSIS — M25511 Pain in right shoulder: Secondary | ICD-10-CM | POA: Diagnosis not present

## 2024-01-10 DIAGNOSIS — M5417 Radiculopathy, lumbosacral region: Secondary | ICD-10-CM | POA: Diagnosis not present

## 2024-01-10 DIAGNOSIS — M545 Low back pain, unspecified: Secondary | ICD-10-CM | POA: Diagnosis not present

## 2024-01-10 DIAGNOSIS — D3610 Benign neoplasm of peripheral nerves and autonomic nervous system, unspecified: Secondary | ICD-10-CM | POA: Diagnosis not present

## 2024-01-14 DIAGNOSIS — R7303 Prediabetes: Secondary | ICD-10-CM | POA: Diagnosis not present

## 2024-01-24 DIAGNOSIS — H348122 Central retinal vein occlusion, left eye, stable: Secondary | ICD-10-CM | POA: Diagnosis not present

## 2024-01-24 DIAGNOSIS — R9389 Abnormal findings on diagnostic imaging of other specified body structures: Secondary | ICD-10-CM | POA: Diagnosis not present

## 2024-01-24 DIAGNOSIS — R933 Abnormal findings on diagnostic imaging of other parts of digestive tract: Secondary | ICD-10-CM | POA: Diagnosis not present

## 2024-01-24 DIAGNOSIS — K2289 Other specified disease of esophagus: Secondary | ICD-10-CM | POA: Diagnosis not present

## 2024-01-24 DIAGNOSIS — Z8711 Personal history of peptic ulcer disease: Secondary | ICD-10-CM | POA: Diagnosis not present

## 2024-01-24 DIAGNOSIS — K3189 Other diseases of stomach and duodenum: Secondary | ICD-10-CM | POA: Diagnosis not present

## 2024-01-24 DIAGNOSIS — Z85048 Personal history of other malignant neoplasm of rectum, rectosigmoid junction, and anus: Secondary | ICD-10-CM | POA: Diagnosis not present

## 2024-01-24 DIAGNOSIS — R590 Localized enlarged lymph nodes: Secondary | ICD-10-CM | POA: Diagnosis not present

## 2024-02-04 DIAGNOSIS — M75111 Incomplete rotator cuff tear or rupture of right shoulder, not specified as traumatic: Secondary | ICD-10-CM | POA: Diagnosis not present

## 2024-02-04 DIAGNOSIS — G8929 Other chronic pain: Secondary | ICD-10-CM | POA: Diagnosis not present

## 2024-02-04 DIAGNOSIS — M25511 Pain in right shoulder: Secondary | ICD-10-CM | POA: Diagnosis not present

## 2024-02-04 DIAGNOSIS — M5412 Radiculopathy, cervical region: Secondary | ICD-10-CM | POA: Diagnosis not present

## 2024-03-27 ENCOUNTER — Encounter: Payer: Self-pay | Admitting: Family Medicine

## 2024-04-21 DIAGNOSIS — M5412 Radiculopathy, cervical region: Secondary | ICD-10-CM | POA: Diagnosis not present

## 2024-04-21 DIAGNOSIS — M75111 Incomplete rotator cuff tear or rupture of right shoulder, not specified as traumatic: Secondary | ICD-10-CM | POA: Diagnosis not present

## 2024-06-04 DIAGNOSIS — R7303 Prediabetes: Secondary | ICD-10-CM | POA: Diagnosis not present

## 2024-06-04 DIAGNOSIS — K2289 Other specified disease of esophagus: Secondary | ICD-10-CM | POA: Diagnosis not present

## 2024-06-04 DIAGNOSIS — E559 Vitamin D deficiency, unspecified: Secondary | ICD-10-CM | POA: Diagnosis not present

## 2024-06-04 DIAGNOSIS — Z13820 Encounter for screening for osteoporosis: Secondary | ICD-10-CM | POA: Diagnosis not present

## 2024-06-04 DIAGNOSIS — Z13220 Encounter for screening for lipoid disorders: Secondary | ICD-10-CM | POA: Diagnosis not present

## 2024-06-04 DIAGNOSIS — Z0000 Encounter for general adult medical examination without abnormal findings: Secondary | ICD-10-CM | POA: Diagnosis not present

## 2024-06-04 DIAGNOSIS — Z13228 Encounter for screening for other metabolic disorders: Secondary | ICD-10-CM | POA: Diagnosis not present

## 2024-06-15 DIAGNOSIS — Z780 Asymptomatic menopausal state: Secondary | ICD-10-CM | POA: Diagnosis not present

## 2024-06-15 DIAGNOSIS — Z13820 Encounter for screening for osteoporosis: Secondary | ICD-10-CM | POA: Diagnosis not present

## 2024-06-16 DIAGNOSIS — K649 Unspecified hemorrhoids: Secondary | ICD-10-CM | POA: Diagnosis not present

## 2024-06-16 DIAGNOSIS — M810 Age-related osteoporosis without current pathological fracture: Secondary | ICD-10-CM | POA: Diagnosis not present

## 2024-06-16 DIAGNOSIS — C20 Malignant neoplasm of rectum: Secondary | ICD-10-CM | POA: Diagnosis not present

## 2024-06-16 DIAGNOSIS — M5410 Radiculopathy, site unspecified: Secondary | ICD-10-CM | POA: Diagnosis not present

## 2024-06-16 DIAGNOSIS — Z87891 Personal history of nicotine dependence: Secondary | ICD-10-CM | POA: Diagnosis not present

## 2024-06-16 DIAGNOSIS — Z972 Presence of dental prosthetic device (complete) (partial): Secondary | ICD-10-CM | POA: Diagnosis not present

## 2024-06-16 DIAGNOSIS — R933 Abnormal findings on diagnostic imaging of other parts of digestive tract: Secondary | ICD-10-CM | POA: Diagnosis not present

## 2024-06-16 DIAGNOSIS — K2289 Other specified disease of esophagus: Secondary | ICD-10-CM | POA: Diagnosis not present

## 2024-06-19 DIAGNOSIS — Q782 Osteopetrosis: Secondary | ICD-10-CM | POA: Diagnosis not present

## 2024-06-19 DIAGNOSIS — M5416 Radiculopathy, lumbar region: Secondary | ICD-10-CM | POA: Diagnosis not present

## 2024-06-26 DIAGNOSIS — E559 Vitamin D deficiency, unspecified: Secondary | ICD-10-CM | POA: Diagnosis not present

## 2024-06-26 DIAGNOSIS — Z79899 Other long term (current) drug therapy: Secondary | ICD-10-CM | POA: Diagnosis not present

## 2024-06-26 DIAGNOSIS — M818 Other osteoporosis without current pathological fracture: Secondary | ICD-10-CM | POA: Diagnosis not present

## 2024-06-26 DIAGNOSIS — Z87891 Personal history of nicotine dependence: Secondary | ICD-10-CM | POA: Diagnosis not present

## 2024-06-26 DIAGNOSIS — Z87442 Personal history of urinary calculi: Secondary | ICD-10-CM | POA: Diagnosis not present

## 2024-06-26 DIAGNOSIS — Z8781 Personal history of (healed) traumatic fracture: Secondary | ICD-10-CM | POA: Diagnosis not present

## 2024-06-26 DIAGNOSIS — Z923 Personal history of irradiation: Secondary | ICD-10-CM | POA: Diagnosis not present

## 2024-07-03 DIAGNOSIS — Z8781 Personal history of (healed) traumatic fracture: Secondary | ICD-10-CM | POA: Diagnosis not present

## 2024-07-03 DIAGNOSIS — M818 Other osteoporosis without current pathological fracture: Secondary | ICD-10-CM | POA: Diagnosis not present

## 2024-07-03 DIAGNOSIS — Z87442 Personal history of urinary calculi: Secondary | ICD-10-CM | POA: Diagnosis not present

## 2024-07-03 DIAGNOSIS — Z79899 Other long term (current) drug therapy: Secondary | ICD-10-CM | POA: Diagnosis not present

## 2024-08-04 DIAGNOSIS — M75121 Complete rotator cuff tear or rupture of right shoulder, not specified as traumatic: Secondary | ICD-10-CM | POA: Diagnosis not present

## 2024-08-04 DIAGNOSIS — M5412 Radiculopathy, cervical region: Secondary | ICD-10-CM | POA: Diagnosis not present

## 2024-08-04 DIAGNOSIS — M818 Other osteoporosis without current pathological fracture: Secondary | ICD-10-CM | POA: Diagnosis not present

## 2024-08-27 DIAGNOSIS — G8929 Other chronic pain: Secondary | ICD-10-CM | POA: Diagnosis not present

## 2024-08-27 DIAGNOSIS — M5416 Radiculopathy, lumbar region: Secondary | ICD-10-CM | POA: Diagnosis not present

## 2024-08-27 DIAGNOSIS — M25511 Pain in right shoulder: Secondary | ICD-10-CM | POA: Diagnosis not present

## 2024-08-27 DIAGNOSIS — I7090 Unspecified atherosclerosis: Secondary | ICD-10-CM | POA: Diagnosis not present

## 2024-08-27 DIAGNOSIS — Z85038 Personal history of other malignant neoplasm of large intestine: Secondary | ICD-10-CM | POA: Diagnosis not present

## 2024-08-27 DIAGNOSIS — E559 Vitamin D deficiency, unspecified: Secondary | ICD-10-CM | POA: Diagnosis not present

## 2024-08-27 DIAGNOSIS — R7303 Prediabetes: Secondary | ICD-10-CM | POA: Diagnosis not present

## 2024-08-27 DIAGNOSIS — M545 Low back pain, unspecified: Secondary | ICD-10-CM | POA: Diagnosis not present

## 2024-08-27 DIAGNOSIS — E7849 Other hyperlipidemia: Secondary | ICD-10-CM | POA: Diagnosis not present

## 2024-08-27 DIAGNOSIS — Q782 Osteopetrosis: Secondary | ICD-10-CM | POA: Diagnosis not present

## 2024-09-26 DIAGNOSIS — W19XXXA Unspecified fall, initial encounter: Secondary | ICD-10-CM | POA: Diagnosis not present

## 2024-09-26 DIAGNOSIS — M4727 Other spondylosis with radiculopathy, lumbosacral region: Secondary | ICD-10-CM | POA: Diagnosis not present

## 2024-09-26 DIAGNOSIS — M5442 Lumbago with sciatica, left side: Secondary | ICD-10-CM | POA: Diagnosis not present

## 2024-10-29 DIAGNOSIS — G8929 Other chronic pain: Secondary | ICD-10-CM | POA: Diagnosis not present

## 2024-10-29 DIAGNOSIS — R7303 Prediabetes: Secondary | ICD-10-CM | POA: Diagnosis not present

## 2024-10-29 DIAGNOSIS — M2550 Pain in unspecified joint: Secondary | ICD-10-CM | POA: Diagnosis not present

## 2024-10-29 DIAGNOSIS — M25511 Pain in right shoulder: Secondary | ICD-10-CM | POA: Diagnosis not present

## 2024-10-29 DIAGNOSIS — E559 Vitamin D deficiency, unspecified: Secondary | ICD-10-CM | POA: Diagnosis not present

## 2024-10-29 DIAGNOSIS — E7849 Other hyperlipidemia: Secondary | ICD-10-CM | POA: Diagnosis not present

## 2024-10-29 DIAGNOSIS — M542 Cervicalgia: Secondary | ICD-10-CM | POA: Diagnosis not present
